# Patient Record
Sex: Female | Born: 1960 | Race: White | Hispanic: No | Marital: Married | Smoking: Never smoker
Health system: Southern US, Community
[De-identification: ages and names within clinical notes are randomized; demographics above are authoritative.]

## PROBLEM LIST (undated history)

## (undated) DIAGNOSIS — K219 Gastro-esophageal reflux disease without esophagitis: Secondary | ICD-10-CM

## (undated) DIAGNOSIS — M1990 Unspecified osteoarthritis, unspecified site: Secondary | ICD-10-CM

## (undated) DIAGNOSIS — D801 Nonfamilial hypogammaglobulinemia: Secondary | ICD-10-CM

## (undated) DIAGNOSIS — E7800 Pure hypercholesterolemia, unspecified: Secondary | ICD-10-CM

## (undated) DIAGNOSIS — J45909 Unspecified asthma, uncomplicated: Secondary | ICD-10-CM

## (undated) DIAGNOSIS — G35 Multiple sclerosis: Secondary | ICD-10-CM

## (undated) HISTORY — PX: WISDOM TOOTH EXTRACTION: SHX21

---
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
## 1980-11-29 HISTORY — PX: WISDOM TOOTH EXTRACTION: SHX21

## 2001-01-12 ENCOUNTER — Other Ambulatory Visit: Admission: RE | Admit: 2001-01-12 | Discharge: 2001-01-12 | Payer: Self-pay | Admitting: Gynecology

## 2001-01-27 ENCOUNTER — Encounter: Admission: RE | Admit: 2001-01-27 | Discharge: 2001-01-27 | Payer: Self-pay | Admitting: Gynecology

## 2001-01-27 ENCOUNTER — Encounter: Payer: Self-pay | Admitting: Gynecology

## 2002-02-07 ENCOUNTER — Other Ambulatory Visit: Admission: RE | Admit: 2002-02-07 | Discharge: 2002-02-07 | Payer: Self-pay | Admitting: Gynecology

## 2002-02-14 ENCOUNTER — Encounter: Payer: Self-pay | Admitting: Gynecology

## 2002-02-14 ENCOUNTER — Encounter: Admission: RE | Admit: 2002-02-14 | Discharge: 2002-02-14 | Payer: Self-pay | Admitting: Gynecology

## 2003-02-12 ENCOUNTER — Other Ambulatory Visit: Admission: RE | Admit: 2003-02-12 | Discharge: 2003-02-12 | Payer: Self-pay | Admitting: Gynecology

## 2003-02-28 ENCOUNTER — Encounter: Payer: Self-pay | Admitting: Gynecology

## 2003-02-28 ENCOUNTER — Encounter: Admission: RE | Admit: 2003-02-28 | Discharge: 2003-02-28 | Payer: Self-pay | Admitting: Gynecology

## 2004-02-18 ENCOUNTER — Other Ambulatory Visit: Admission: RE | Admit: 2004-02-18 | Discharge: 2004-02-18 | Payer: Self-pay | Admitting: Gynecology

## 2004-03-12 ENCOUNTER — Encounter: Admission: RE | Admit: 2004-03-12 | Discharge: 2004-03-12 | Payer: Self-pay | Admitting: Gynecology

## 2005-02-18 ENCOUNTER — Other Ambulatory Visit: Admission: RE | Admit: 2005-02-18 | Discharge: 2005-02-18 | Payer: Self-pay | Admitting: Gynecology

## 2005-03-17 ENCOUNTER — Encounter: Admission: RE | Admit: 2005-03-17 | Discharge: 2005-03-17 | Payer: Self-pay | Admitting: Gynecology

## 2006-02-22 ENCOUNTER — Other Ambulatory Visit: Admission: RE | Admit: 2006-02-22 | Discharge: 2006-02-22 | Payer: Self-pay | Admitting: Gynecology

## 2006-03-18 ENCOUNTER — Encounter: Admission: RE | Admit: 2006-03-18 | Discharge: 2006-03-18 | Payer: Self-pay | Admitting: Gynecology

## 2007-02-28 ENCOUNTER — Other Ambulatory Visit: Admission: RE | Admit: 2007-02-28 | Discharge: 2007-02-28 | Payer: Self-pay | Admitting: Gynecology

## 2007-03-23 ENCOUNTER — Encounter: Admission: RE | Admit: 2007-03-23 | Discharge: 2007-03-23 | Payer: Self-pay | Admitting: Gynecology

## 2008-03-25 ENCOUNTER — Encounter: Admission: RE | Admit: 2008-03-25 | Discharge: 2008-03-25 | Payer: Self-pay | Admitting: Gynecology

## 2009-03-31 ENCOUNTER — Encounter: Admission: RE | Admit: 2009-03-31 | Discharge: 2009-03-31 | Payer: Self-pay | Admitting: Gynecology

## 2010-04-07 ENCOUNTER — Encounter: Admission: RE | Admit: 2010-04-07 | Discharge: 2010-04-07 | Payer: Self-pay | Admitting: Gynecology

## 2010-11-29 HISTORY — PX: COLONOSCOPY: SHX174

## 2011-03-17 ENCOUNTER — Other Ambulatory Visit: Payer: Self-pay | Admitting: Gynecology

## 2011-03-17 DIAGNOSIS — Z1231 Encounter for screening mammogram for malignant neoplasm of breast: Secondary | ICD-10-CM

## 2011-04-09 ENCOUNTER — Ambulatory Visit
Admission: RE | Admit: 2011-04-09 | Discharge: 2011-04-09 | Disposition: A | Discharge status: Home / Self Care | Payer: BC Managed Care – PPO | Source: Ambulatory Visit | Attending: Gynecology | Admitting: Gynecology

## 2011-04-09 DIAGNOSIS — Z1231 Encounter for screening mammogram for malignant neoplasm of breast: Secondary | ICD-10-CM

## 2012-03-13 ENCOUNTER — Other Ambulatory Visit: Payer: Self-pay | Admitting: Gynecology

## 2012-03-13 DIAGNOSIS — Z1231 Encounter for screening mammogram for malignant neoplasm of breast: Secondary | ICD-10-CM

## 2012-04-10 ENCOUNTER — Ambulatory Visit
Admission: RE | Admit: 2012-04-10 | Discharge: 2012-04-10 | Disposition: A | Discharge status: Home / Self Care | Payer: BC Managed Care – PPO | Source: Ambulatory Visit | Attending: Gynecology | Admitting: Gynecology

## 2012-04-10 DIAGNOSIS — Z1231 Encounter for screening mammogram for malignant neoplasm of breast: Secondary | ICD-10-CM

## 2013-03-08 ENCOUNTER — Other Ambulatory Visit: Payer: Self-pay

## 2013-03-08 DIAGNOSIS — Z1231 Encounter for screening mammogram for malignant neoplasm of breast: Secondary | ICD-10-CM

## 2013-04-09 DIAGNOSIS — M25572 Pain in left ankle and joints of left foot: Secondary | ICD-10-CM | POA: Insufficient documentation

## 2013-04-12 ENCOUNTER — Ambulatory Visit
Admission: RE | Admit: 2013-04-12 | Discharge: 2013-04-12 | Disposition: A | Discharge status: Home / Self Care | Payer: BC Managed Care – PPO | Source: Ambulatory Visit

## 2013-04-12 DIAGNOSIS — Z1231 Encounter for screening mammogram for malignant neoplasm of breast: Secondary | ICD-10-CM

## 2013-06-19 ENCOUNTER — Other Ambulatory Visit: Payer: Self-pay | Admitting: Orthopedic Surgery

## 2013-06-19 DIAGNOSIS — M25572 Pain in left ankle and joints of left foot: Secondary | ICD-10-CM

## 2013-06-19 DIAGNOSIS — R609 Edema, unspecified: Secondary | ICD-10-CM

## 2013-06-21 ENCOUNTER — Ambulatory Visit
Admission: RE | Admit: 2013-06-21 | Discharge: 2013-06-21 | Disposition: A | Discharge status: Home / Self Care | Payer: BC Managed Care – PPO | Source: Ambulatory Visit | Attending: Orthopedic Surgery | Admitting: Orthopedic Surgery

## 2013-06-21 DIAGNOSIS — R609 Edema, unspecified: Secondary | ICD-10-CM

## 2013-06-21 DIAGNOSIS — M25572 Pain in left ankle and joints of left foot: Secondary | ICD-10-CM

## 2014-03-11 ENCOUNTER — Other Ambulatory Visit: Payer: Self-pay

## 2014-03-11 DIAGNOSIS — Z1231 Encounter for screening mammogram for malignant neoplasm of breast: Secondary | ICD-10-CM

## 2014-04-16 ENCOUNTER — Ambulatory Visit
Admission: RE | Admit: 2014-04-16 | Discharge: 2014-04-16 | Disposition: A | Discharge status: Home / Self Care | Payer: BC Managed Care – PPO | Source: Ambulatory Visit

## 2014-04-16 ENCOUNTER — Encounter (INDEPENDENT_AMBULATORY_CARE_PROVIDER_SITE_OTHER): Payer: Self-pay

## 2014-04-16 DIAGNOSIS — Z1231 Encounter for screening mammogram for malignant neoplasm of breast: Secondary | ICD-10-CM

## 2014-04-17 ENCOUNTER — Other Ambulatory Visit: Payer: Self-pay | Admitting: Gynecology

## 2014-04-17 DIAGNOSIS — R928 Other abnormal and inconclusive findings on diagnostic imaging of breast: Secondary | ICD-10-CM

## 2014-04-29 ENCOUNTER — Ambulatory Visit
Admission: RE | Admit: 2014-04-29 | Discharge: 2014-04-29 | Disposition: A | Discharge status: Home / Self Care | Payer: BC Managed Care – PPO | Source: Ambulatory Visit | Attending: Gynecology | Admitting: Gynecology

## 2014-04-29 DIAGNOSIS — R928 Other abnormal and inconclusive findings on diagnostic imaging of breast: Secondary | ICD-10-CM

## 2014-10-08 ENCOUNTER — Other Ambulatory Visit: Payer: Self-pay | Admitting: Gynecology

## 2014-10-08 DIAGNOSIS — R921 Mammographic calcification found on diagnostic imaging of breast: Secondary | ICD-10-CM

## 2014-10-30 ENCOUNTER — Ambulatory Visit
Admission: RE | Admit: 2014-10-30 | Discharge: 2014-10-30 | Disposition: A | Discharge status: Home / Self Care | Payer: BC Managed Care – PPO | Source: Ambulatory Visit | Attending: Gynecology | Admitting: Gynecology

## 2014-10-30 DIAGNOSIS — R921 Mammographic calcification found on diagnostic imaging of breast: Secondary | ICD-10-CM

## 2015-03-13 ENCOUNTER — Other Ambulatory Visit: Payer: Self-pay

## 2015-03-13 DIAGNOSIS — Z1231 Encounter for screening mammogram for malignant neoplasm of breast: Secondary | ICD-10-CM

## 2015-05-02 ENCOUNTER — Ambulatory Visit
Admission: RE | Admit: 2015-05-02 | Discharge: 2015-05-02 | Disposition: A | Discharge status: Home / Self Care | Payer: 59 | Source: Ambulatory Visit

## 2015-05-02 DIAGNOSIS — Z1231 Encounter for screening mammogram for malignant neoplasm of breast: Secondary | ICD-10-CM

## 2015-06-10 ENCOUNTER — Encounter (HOSPITAL_COMMUNITY)
Admission: RE | Admit: 2015-06-10 | Discharge: 2015-06-10 | Disposition: A | Discharge status: Home / Self Care | Payer: 59 | Source: Ambulatory Visit | Attending: Psychiatry | Admitting: Psychiatry

## 2015-06-10 ENCOUNTER — Encounter (INDEPENDENT_AMBULATORY_CARE_PROVIDER_SITE_OTHER): Payer: Self-pay

## 2015-06-10 ENCOUNTER — Encounter (HOSPITAL_COMMUNITY): Payer: Self-pay

## 2015-06-10 ENCOUNTER — Other Ambulatory Visit (HOSPITAL_COMMUNITY): Payer: Self-pay | Admitting: Psychiatry

## 2015-06-10 DIAGNOSIS — G35 Multiple sclerosis: Secondary | ICD-10-CM | POA: Diagnosis not present

## 2015-06-10 HISTORY — DX: Multiple sclerosis: G35

## 2015-06-10 HISTORY — DX: Pure hypercholesterolemia, unspecified: E78.00

## 2015-06-10 MED ORDER — ACETAMINOPHEN 500 MG PO TABS
500.0000 mg | ORAL_TABLET | Freq: Once | ORAL | Status: AC
  Administered 2015-06-10: 500 mg via ORAL
  Filled 2015-06-10: qty 1

## 2015-06-10 MED ORDER — METHYLPREDNISOLONE SODIUM SUCC 125 MG IJ SOLR
125.0000 mg | Freq: Every day | INTRAMUSCULAR | Status: DC
  Administered 2015-06-10: 125 mg via INTRAVENOUS
  Filled 2015-06-10 (×2): qty 2

## 2015-06-10 MED ORDER — SODIUM CHLORIDE 0.9 % IV SOLN
1000.0000 mg | Freq: Every day | INTRAVENOUS | Status: DC
  Administered 2015-06-10: 1000 mg via INTRAVENOUS
  Filled 2015-06-10 (×2): qty 100

## 2015-06-10 MED ORDER — SODIUM CHLORIDE 0.9 % IV SOLN
Freq: Every day | INTRAVENOUS | Status: DC
  Administered 2015-06-10: 250 mL via INTRAVENOUS

## 2015-06-10 MED ORDER — DIPHENHYDRAMINE HCL 50 MG/ML IJ SOLN
INTRAMUSCULAR | Status: AC
  Filled 2015-06-10: qty 1

## 2015-06-10 MED ORDER — ACETAMINOPHEN 500 MG PO TABS
500.0000 mg | ORAL_TABLET | Freq: Every day | ORAL | Status: DC
  Administered 2015-06-10: 500 mg via ORAL
  Filled 2015-06-10 (×2): qty 1

## 2015-06-10 MED ORDER — DIPHENHYDRAMINE HCL 50 MG/ML IJ SOLN
50.0000 mg | Freq: Every day | INTRAMUSCULAR | Status: DC
  Administered 2015-06-10: 50 mg via INTRAVENOUS
  Filled 2015-06-10: qty 1

## 2015-06-10 MED ORDER — DIPHENHYDRAMINE HCL 50 MG/ML IJ SOLN
25.0000 mg | Freq: Once | INTRAMUSCULAR | Status: AC
  Administered 2015-06-10: 25 mg via INTRAVENOUS

## 2015-06-10 NOTE — Discharge Instructions (Signed)
Rituximab injection What is this medicine? RITUXIMAB (ri TUX i mab) is a monoclonal antibody. This medicine changes the way the body's immune system works. It is used commonly to treat non-Hodgkin's lymphoma and other conditions. In cancer cells, this drug targets a specific protein within cancer cells and stops the cancer cells from growing. It is also used to treat rhuematoid arthritis (RA). In RA, this medicine slow the inflammatory process and help reduce joint pain and swelling. This medicine is often used with other cancer or arthritis medications. This medicine may be used for other purposes; ask your health care provider or pharmacist if you have questions. COMMON BRAND NAME(S): Rituxan What should I tell my health care provider before I take this medicine? They need to know if you have any of these conditions: -blood disorders -heart disease -history of hepatitis B -infection (especially a virus infection such as chickenpox, cold sores, or herpes) -irregular heartbeat -kidney disease -lung or breathing disease, like asthma -lupus -an unusual or allergic reaction to rituximab, mouse proteins, other medicines, foods, dyes, or preservatives -pregnant or trying to get pregnant -breast-feeding How should I use this medicine? This medicine is for infusion into a vein. It is administered in a hospital or clinic by a specially trained health care professional. A special MedGuide will be given to you by the pharmacist with each prescription and refill. Be sure to read this information carefully each time. Talk to your pediatrician regarding the use of this medicine in children. This medicine is not approved for use in children. Overdosage: If you think you have taken too much of this medicine contact a poison control center or emergency room at once. NOTE: This medicine is only for you. Do not share this medicine with others. What if I miss a dose? It is important not to miss a dose. Call  your doctor or health care professional if you are unable to keep an appointment. What may interact with this medicine? -cisplatin -medicines for blood pressure -some other medicines for arthritis -vaccines This list may not describe all possible interactions. Give your health care provider a list of all the medicines, herbs, non-prescription drugs, or dietary supplements you use. Also tell them if you smoke, drink alcohol, or use illegal drugs. Some items may interact with your medicine. What should I watch for while using this medicine? Report any side effects that you notice during your treatment right away, such as changes in your breathing, fever, chills, dizziness or lightheadedness. These effects are more common with the first dose. Visit your prescriber or health care professional for checks on your progress. You will need to have regular blood work. Report any other side effects. The side effects of this medicine can continue after you finish your treatment. Continue your course of treatment even though you feel ill unless your doctor tells you to stop. Call your doctor or health care professional for advice if you get a fever, chills or sore throat, or other symptoms of a cold or flu. Do not treat yourself. This drug decreases your body's ability to fight infections. Try to avoid being around people who are sick. This medicine may increase your risk to bruise or bleed. Call your doctor or health care professional if you notice any unusual bleeding. Be careful brushing and flossing your teeth or using a toothpick because you may get an infection or bleed more easily. If you have any dental work done, tell your dentist you are receiving this medicine. Avoid taking products  that contain aspirin, acetaminophen, ibuprofen, naproxen, or ketoprofen unless instructed by your doctor. These medicines may hide a fever. Do not become pregnant while taking this medicine. Women should inform their doctor  if they wish to become pregnant or think they might be pregnant. There is a potential for serious side effects to an unborn child. Talk to your health care professional or pharmacist for more information. Do not breast-feed an infant while taking this medicine. What side effects may I notice from receiving this medicine? Side effects that you should report to your doctor or health care professional as soon as possible: -allergic reactions like skin rash, itching or hives, swelling of the face, lips, or tongue -low blood counts - this medicine may decrease the number of white blood cells, red blood cells and platelets. You may be at increased risk for infections and bleeding. -signs of infection - fever or chills, cough, sore throat, pain or difficulty passing urine -signs of decreased platelets or bleeding - bruising, pinpoint red spots on the skin, black, tarry stools, blood in the urine -signs of decreased red blood cells - unusually weak or tired, fainting spells, lightheadedness -breathing problems -confused, not responsive -chest pain -fast, irregular heartbeat -feeling faint or lightheaded, falls -mouth sores -redness, blistering, peeling or loosening of the skin, including inside the mouth -stomach pain -swelling of the ankles, feet, or hands -trouble passing urine or change in the amount of urine Side effects that usually do not require medical attention (report to your doctor or other health care professional if they continue or are bothersome): -anxiety -headache -loss of appetite -muscle aches -nausea -night sweats This list may not describe all possible side effects. Call your doctor for medical advice about side effects. You may report side effects to FDA at 1-800-FDA-1088. Where should I keep my medicine? This drug is given in a hospital or clinic and will not be stored at home. NOTE: This sheet is a summary. It may not cover all possible information. If you have questions  about this medicine, talk to your doctor, pharmacist, or health care provider.  2015, Elsevier/Gold Standard. (2008-07-15 14:04:59) Multiple Sclerosis Multiple sclerosis (MS) is a disease of the central nervous system. It leads to the loss of the insulating covering of the nerves (myelin sheath) of your brain. When this happens, brain signals do not get sent properly or may not get sent at all. The age of onset of MS varies.  CAUSES The cause of MS is unknown. However, it is more common in the [place] than in the [place]. RISK FACTORS There is a higher number of women with MS than men. MS is not an illness that is passed down to you from your family members (inherited). However, your risk of MS is higher if you have a relative with MS. SIGNS AND SYMPTOMS  The symptoms of MS occur in episodes or attacks. These attacks may last weeks to months. There may be long periods of almost no symptoms between attacks. The symptoms of MS vary. This is because of the many different ways it affects the central nervous system. The main symptoms of MS include:  Vision problems and eye pain.  Numbness.  Weakness.  Inability to move your arms, hands, feet, or legs (paralysis).  Balance problems.  Tremors. DIAGNOSIS  Your health care provider can diagnose MS with the help of imaging exams and lab tests. These may include specialized X-ray exams and spinal fluid tests. The best imaging exam  to confirm a diagnosis of MS is an MRI. TREATMENT  There is no known cure for MS, but there are medicines that can decrease the number and frequency of attacks. Steroids are often used for short-term relief. Physical and occupational therapy may also help. There are also many new alternative or complementary treatments available to help control the symptoms of MS. Ask your health care provider if any of these other options are right for you. HOME CARE INSTRUCTIONS   Take medicines as directed  by your health care provider.  Exercise as directed by your health care provider. SEEK MEDICAL CARE IF: You begin to feel depressed. SEEK IMMEDIATE MEDICAL CARE IF:  You develop paralysis.  You have problems with bladder, bowel, or sexual function.  You develop mental changes, such as forgetfulness or mood swings.  You have a period of uncontrolled movements (seizure). Document Released: 11/12/2000 Document Revised: 11/20/2013 Document Reviewed: 07/23/2013 [facility] Patient Information 2015 [place], [place]. This information is not intended to replace advice given to you by your health care provider. Make sure you discuss any questions you have with your health care provider.

## 2015-06-10 NOTE — Progress Notes (Signed)
Ear and throat itching have both resolved. Saline still infusing at [facility]. I have placed call to Dr Bonnee Quin office x2 requesting the faxed orders for additional Benadryl and left voice mail messages on [facility] and [facility] VM. Pt is eating lunch and voicing no c/o

## 2015-06-10 NOTE — Progress Notes (Signed)
Patient dozing at intervals. Stated tickling throat has subsided after Rituxan paused and before benadyl given. Denies any other problems.

## 2015-06-10 NOTE — Progress Notes (Signed)
Pt here for her 1st Rituxan infusion. Premeds given as ordered It was started at 0950 and ramped up at 30 minute intervals as ordered. As I was entering room to increase to 200mg /hr rate pt stated that her"   throat begin itching 10 minutes ago and she was very cold" Rituxan turned off ( she had only received 67 ml) and saline infusing. VSS (see flow chart) placed call to Dr Jacqulynn Cadet and spoke to his nurse Janett Billow who relayed this information to Dr Dellis Filbert. Orders to be sent to give additional Benadryl and after 15-20 minutes restart Rituxan at a slower rate.

## 2015-06-10 NOTE — Progress Notes (Signed)
Restarting infusion of RITUXAN at 100mg /hr (37 ml/hr) and evaluating patient for any further c/o

## 2015-06-10 NOTE — Progress Notes (Signed)
Again called office to request faxed orders for Benadryl. Spoke with [place] and orders received. Benadryl given as ordered. To resume RITUXAN in 15 minutes

## 2015-06-10 NOTE — Progress Notes (Addendum)
C/o mild itching of right ear but the throat itching is resolving. Pt watching TV.Rituxan remains off

## 2015-06-10 NOTE — Progress Notes (Signed)
Continuing infusion of Rituxan at 100mg /hr (3ml/hr). Pt states she has a slight headache. Tylenol  500 mg po repeated as ordered. Pt up to BR. Watching TV and texting. Voices no other c/o at this rate. Will continue at this rate until end of infusion

## 2015-06-24 ENCOUNTER — Encounter (HOSPITAL_COMMUNITY)
Admission: RE | Admit: 2015-06-24 | Discharge: 2015-06-24 | Disposition: A | Discharge status: Home / Self Care | Payer: 59 | Source: Ambulatory Visit | Attending: Psychiatry | Admitting: Psychiatry

## 2015-06-24 ENCOUNTER — Encounter (HOSPITAL_COMMUNITY): Payer: Self-pay

## 2015-06-24 DIAGNOSIS — G35 Multiple sclerosis: Secondary | ICD-10-CM | POA: Diagnosis not present

## 2015-06-24 MED ORDER — DIPHENHYDRAMINE HCL 50 MG/ML IJ SOLN
50.0000 mg | Freq: Every day | INTRAMUSCULAR | Status: AC
  Administered 2015-06-24: 50 mg via INTRAVENOUS
  Filled 2015-06-24: qty 1

## 2015-06-24 MED ORDER — SODIUM CHLORIDE 0.9 % IV SOLN
Freq: Every day | INTRAVENOUS | Status: AC
  Administered 2015-06-24: 250 mL via INTRAVENOUS

## 2015-06-24 MED ORDER — ACETAMINOPHEN 500 MG PO TABS
500.0000 mg | ORAL_TABLET | Freq: Every day | ORAL | Status: AC
  Administered 2015-06-24: 500 mg via ORAL
  Filled 2015-06-24: qty 1

## 2015-06-24 MED ORDER — ACETAMINOPHEN 500 MG PO TABS: 500.0000 mg | ORAL_TABLET | Freq: Once | ORAL | Status: AC | PRN

## 2015-06-24 MED ORDER — METHYLPREDNISOLONE SODIUM SUCC 125 MG IJ SOLR
125.0000 mg | Freq: Every day | INTRAMUSCULAR | Status: AC
  Administered 2015-06-24: 125 mg via INTRAVENOUS
  Filled 2015-06-24: qty 2

## 2015-06-24 MED ORDER — SODIUM CHLORIDE 0.9 % IV SOLN
1000.0000 mg | Freq: Every day | INTRAVENOUS | Status: AC
  Administered 2015-06-24: 1000 mg via INTRAVENOUS
  Filled 2015-06-24: qty 100

## 2015-06-24 NOTE — Progress Notes (Signed)
Pt arrived today for her 2nd infusion of Rituxan in this series. After last infusion, in which she had some throat itching that was treated with a slower rate and additional Pura Spice, she had an appointment with Dr Rudell Cobb.it was decided to continue with her 2nd infusion and tentatively to receive it every 6 months. I spoke to [facility] at dr [facility] office who confirmed this and sent additional orders for rescue medication . We discussed that  that the infusion today was still be as Dose 1 with the ramp up of 50 mg/hr every 30 minutes to a maximum of 400mg /hr. Premeds of Tylenol,Benadryl and Solumedrol given as ordered. Pt states she had also taken 400mg  of Ibuprofen this AM prior to arrival. Infusion progressed and infused over 5 hours and pt had no c/o and VSS. Pt and husband very pleased that she had an uneventful  2nd infusion. She has an appointment with Dr Jacqulynn Cadet October 2016 and I have a tentative appointment scheduled for here in Short Stay for an appointment in 6 months ( 12/23/15). We will call for Rituxan orders.

## 2015-12-23 ENCOUNTER — Encounter (HOSPITAL_COMMUNITY): Admission: RE | Admit: 2015-12-23 | Payer: 59 | Source: Ambulatory Visit

## 2017-01-25 DIAGNOSIS — L309 Dermatitis, unspecified: Secondary | ICD-10-CM | POA: Diagnosis not present

## 2017-01-27 DIAGNOSIS — G35 Multiple sclerosis: Secondary | ICD-10-CM | POA: Diagnosis not present

## 2017-01-27 DIAGNOSIS — D72819 Decreased white blood cell count, unspecified: Secondary | ICD-10-CM | POA: Diagnosis not present

## 2017-02-10 DIAGNOSIS — G35 Multiple sclerosis: Secondary | ICD-10-CM | POA: Diagnosis not present

## 2017-02-15 DIAGNOSIS — Z8719 Personal history of other diseases of the digestive system: Secondary | ICD-10-CM | POA: Diagnosis not present

## 2017-02-15 DIAGNOSIS — E782 Mixed hyperlipidemia: Secondary | ICD-10-CM | POA: Diagnosis not present

## 2017-02-15 DIAGNOSIS — J309 Allergic rhinitis, unspecified: Secondary | ICD-10-CM | POA: Diagnosis not present

## 2017-02-23 DIAGNOSIS — R945 Abnormal results of liver function studies: Secondary | ICD-10-CM | POA: Diagnosis not present

## 2017-04-14 DIAGNOSIS — L718 Other rosacea: Secondary | ICD-10-CM | POA: Diagnosis not present

## 2017-06-06 DIAGNOSIS — D225 Melanocytic nevi of trunk: Secondary | ICD-10-CM | POA: Diagnosis not present

## 2017-06-06 DIAGNOSIS — L718 Other rosacea: Secondary | ICD-10-CM | POA: Diagnosis not present

## 2017-06-06 DIAGNOSIS — L814 Other melanin hyperpigmentation: Secondary | ICD-10-CM | POA: Diagnosis not present

## 2017-06-16 DIAGNOSIS — N951 Menopausal and female climacteric states: Secondary | ICD-10-CM | POA: Diagnosis not present

## 2017-06-16 DIAGNOSIS — Z1231 Encounter for screening mammogram for malignant neoplasm of breast: Secondary | ICD-10-CM | POA: Diagnosis not present

## 2017-06-16 DIAGNOSIS — Z01419 Encounter for gynecological examination (general) (routine) without abnormal findings: Secondary | ICD-10-CM | POA: Diagnosis not present

## 2017-08-04 DIAGNOSIS — G35 Multiple sclerosis: Secondary | ICD-10-CM | POA: Diagnosis not present

## 2017-08-04 DIAGNOSIS — D72819 Decreased white blood cell count, unspecified: Secondary | ICD-10-CM | POA: Diagnosis not present

## 2017-08-18 DIAGNOSIS — Z23 Encounter for immunization: Secondary | ICD-10-CM | POA: Diagnosis not present

## 2017-08-18 DIAGNOSIS — G35 Multiple sclerosis: Secondary | ICD-10-CM | POA: Diagnosis not present

## 2017-09-22 DIAGNOSIS — J309 Allergic rhinitis, unspecified: Secondary | ICD-10-CM | POA: Diagnosis not present

## 2017-09-22 DIAGNOSIS — E782 Mixed hyperlipidemia: Secondary | ICD-10-CM | POA: Diagnosis not present

## 2017-09-22 DIAGNOSIS — G4700 Insomnia, unspecified: Secondary | ICD-10-CM | POA: Diagnosis not present

## 2017-10-19 DIAGNOSIS — G35 Multiple sclerosis: Secondary | ICD-10-CM | POA: Diagnosis not present

## 2018-02-21 DIAGNOSIS — G35 Multiple sclerosis: Secondary | ICD-10-CM | POA: Diagnosis not present

## 2018-02-21 DIAGNOSIS — D72819 Decreased white blood cell count, unspecified: Secondary | ICD-10-CM | POA: Diagnosis not present

## 2018-02-22 DIAGNOSIS — D72819 Decreased white blood cell count, unspecified: Secondary | ICD-10-CM | POA: Diagnosis not present

## 2018-03-09 DIAGNOSIS — G35 Multiple sclerosis: Secondary | ICD-10-CM | POA: Diagnosis not present

## 2018-03-14 DIAGNOSIS — J309 Allergic rhinitis, unspecified: Secondary | ICD-10-CM | POA: Diagnosis not present

## 2018-03-14 DIAGNOSIS — G35 Multiple sclerosis: Secondary | ICD-10-CM | POA: Diagnosis not present

## 2018-03-14 DIAGNOSIS — H04123 Dry eye syndrome of bilateral lacrimal glands: Secondary | ICD-10-CM | POA: Diagnosis not present

## 2018-03-14 DIAGNOSIS — H40013 Open angle with borderline findings, low risk, bilateral: Secondary | ICD-10-CM | POA: Diagnosis not present

## 2018-03-14 DIAGNOSIS — E782 Mixed hyperlipidemia: Secondary | ICD-10-CM | POA: Diagnosis not present

## 2018-03-14 DIAGNOSIS — G4700 Insomnia, unspecified: Secondary | ICD-10-CM | POA: Diagnosis not present

## 2018-04-27 DIAGNOSIS — T22111A Burn of first degree of right forearm, initial encounter: Secondary | ICD-10-CM | POA: Diagnosis not present

## 2018-06-08 DIAGNOSIS — D1801 Hemangioma of skin and subcutaneous tissue: Secondary | ICD-10-CM | POA: Diagnosis not present

## 2018-06-08 DIAGNOSIS — L814 Other melanin hyperpigmentation: Secondary | ICD-10-CM | POA: Diagnosis not present

## 2018-06-08 DIAGNOSIS — D225 Melanocytic nevi of trunk: Secondary | ICD-10-CM | POA: Diagnosis not present

## 2018-06-20 ENCOUNTER — Ambulatory Visit (INDEPENDENT_AMBULATORY_CARE_PROVIDER_SITE_OTHER): Payer: 59 | Admitting: Physician Assistant

## 2018-06-20 ENCOUNTER — Encounter: Payer: Self-pay | Admitting: Physician Assistant

## 2018-06-20 ENCOUNTER — Other Ambulatory Visit: Payer: Self-pay

## 2018-06-20 VITALS — BP 118/69 | HR 80 | Temp 97.9°F | Resp 17 | Ht 64.0 in | Wt 166.6 lb

## 2018-06-20 DIAGNOSIS — J209 Acute bronchitis, unspecified: Secondary | ICD-10-CM

## 2018-06-20 DIAGNOSIS — J3489 Other specified disorders of nose and nasal sinuses: Secondary | ICD-10-CM | POA: Diagnosis not present

## 2018-06-20 MED ORDER — BENZONATATE 100 MG PO CAPS: 100.0000 mg | ORAL_CAPSULE | Freq: Three times a day (TID) | ORAL | 0 refills | Status: DC | PRN

## 2018-06-20 MED ORDER — HYDROCODONE-HOMATROPINE 5-1.5 MG/5ML PO SYRP: 5.0000 mL | ORAL_SOLUTION | Freq: Three times a day (TID) | ORAL | 0 refills | Status: DC | PRN

## 2018-06-20 MED ORDER — IPRATROPIUM BROMIDE 0.03 % NA SOLN: 2.0000 | Freq: Two times a day (BID) | NASAL | 0 refills | Status: DC

## 2018-06-20 MED ORDER — AZITHROMYCIN 250 MG PO TABS: ORAL_TABLET | ORAL | 0 refills | Status: DC

## 2018-06-20 NOTE — Patient Instructions (Addendum)
- We will treat this for underlying bacterial etiology with a zpack. I also recommend symptomatic treatment. - I recommend you rest, drink plenty of fluids, eat light meals including soups.  - You may use cough syrup at night for your cough and sore throat, Tessalon pearls during the day. Be aware that cough syrup can definitely make you drowsy and sleepy so do not drive or operate any heavy machinery if it is affecting you during the day.  -You can use atrovent nasal spray for runny nose. Tea recipe for cough: boil water, add 2 inches shaved ginger root, steep 15 minutes, add juice from 2 full lemons, and 2 tbsp honey. - Please let me know if you are not seeing any improvement or get worse in 7 days or if any of your symptoms worsen with current treatment.   Acute Bronchitis, Adult Acute bronchitis is when air tubes (bronchi) in the lungs suddenly get swollen. The condition can make it hard to breathe. It can also cause these symptoms:  A cough.  Coughing up clear, yellow, or green mucus.  Wheezing.  Chest congestion.  Shortness of breath.  A fever.  Body aches.  Chills.  A sore throat.  Follow these instructions at home: Medicines  Take over-the-counter and prescription medicines only as told by your doctor.  If you were prescribed an antibiotic medicine, take it as told by your doctor. Do not stop taking the antibiotic even if you start to feel better. General instructions  Rest.  Drink enough fluids to keep your pee (urine) clear or pale yellow.  Avoid smoking and secondhand smoke. If you smoke and you need help quitting, ask your doctor. Quitting will help your lungs heal faster.  Use an inhaler, cool mist vaporizer, or humidifier as told by your doctor.  Keep all follow-up visits as told by your doctor. This is important. How is this prevented? To lower your risk of getting this condition again:  Wash your hands often with soap and water. If you cannot use soap  and water, use hand sanitizer.  Avoid contact with people who have cold symptoms.  Try not to touch your hands to your mouth, nose, or eyes.  Make sure to get the flu shot every year.  Contact a doctor if:  Your symptoms do not get better in 2 weeks. Get help right away if:  You cough up blood.  You have chest pain.  You have very bad shortness of breath.  You become dehydrated.  You faint (pass out) or keep feeling like you are going to pass out.  You keep throwing up (vomiting).  You have a very bad headache.  Your fever or chills gets worse. This information is not intended to replace advice given to you by your health care provider. Make sure you discuss any questions you have with your health care provider. Document Released: 05/03/2008 Document Revised: 06/23/2016 Document Reviewed: 05/05/2016 Elsevier Interactive Patient Education  2018 [employer].    IF you received an x-ray today, you will receive an invoice from [facility]. Please contact [facility] at [phone] with questions or concerns regarding your invoice.   IF you received labwork today, you will receive an invoice from [place]. Please contact [facility] at [phone] with questions or concerns regarding your invoice.   Our billing staff will not be able to assist you with questions regarding bills from these companies.  You will be contacted with the lab results as soon as they are available.  The fastest way to get your results is to activate your My Chart account. Instructions are located on the last page of this paperwork. If you have not heard from [place] regarding the results in 2 weeks, please contact this office.

## 2018-06-20 NOTE — Progress Notes (Signed)
MRN: [id number] DOB: [birth year]  Subjective:   Mary Odom is a 57 y.o. female presenting for chief complaint of URI (x 2 weeks, sudafed, dayquil, nyquil and delsym.  Lg temps, 99.3 this morning, coughing started last thursday 06/15/18 and head cold, green/yellow mucus drainage.  Son dx w/ pnuemonia) .  Was sick in 03/2018 with head cold and chest congestion, got better, but then returned 2 weeks ago. Reports 2 week history of illness. Has nasal congestion, rhinorrhea,, productive cough, and low grade fever. Has tried nyquil, dayquil, sudafed with some relief. Denies ear pain, sore throat, wheezing, shortness of breath and chest pain, sinus pain,  nausea, vomiting, abdominal pain and diarrhea. Has had sick contact with son who was diagnosed pneumonia. Marland Kitchen Has history of seasonal allergies, no history of asthma or COPD. Has PMH of MS. Patient has had flu shot this season. Denies smoking. Denies any other aggravating or relieving factors, no other questions or concerns.  Mary Odom has a current medication list which includes the following prescription(s): aspirin, cetirizine, gabapentin, ibuprofen, modafinil, fish oil, polyethylene glycol, psyllium, pyridoxine, simvastatin, temazepam, and vitamin d (ergocalciferol). Also has No Known Allergies.  Mary Odom  has a past medical history of High cholesterol and MS (multiple sclerosis) ([place]). Also  has a past surgical history that includes Wisdom tooth extraction and Colonoscopy (2012).   Objective:   Vitals: BP 118/69 (BP Location: Left Arm, Patient Position: Sitting, Cuff Size: Large)   Pulse 80   Temp 97.9 F (36.6 C) (Oral)   Resp 17   Ht 5\' 4"  (1.626 m)   Wt 166 lb 9.6 oz (75.6 kg)   LMP 04/04/2011   SpO2 97%   BMI 28.60 kg/m   Physical Exam  Constitutional: She is oriented to person, place, and time. She appears well-developed and well-nourished. No distress.  HENT:  Head: Normocephalic and atraumatic.  Right Ear: Tympanic membrane, external  ear and ear canal normal.  Left Ear: Tympanic membrane, external ear and ear canal normal.  Nose: Mucosal edema and rhinorrhea present. Right sinus exhibits no maxillary sinus tenderness and no frontal sinus tenderness. Left sinus exhibits no maxillary sinus tenderness and no frontal sinus tenderness.  Mouth/Throat: Uvula is midline and mucous membranes are normal. No posterior oropharyngeal edema, posterior oropharyngeal erythema or tonsillar abscesses. No tonsillar exudate.  Eyes: Conjunctivae are normal.  Neck: Normal range of motion.  Cardiovascular: Normal rate, regular rhythm, normal heart sounds and intact distal pulses.  Pulmonary/Chest: Effort normal and breath sounds normal. No respiratory distress. She has no decreased breath sounds. She has no wheezes. She has no rhonchi. She has no rales.  Lymphadenopathy:       Head (right side): No submental, no submandibular, no tonsillar, no preauricular, no posterior auricular and no occipital adenopathy present.       Head (left side): No submental, no submandibular, no tonsillar, no preauricular, no posterior auricular and no occipital adenopathy present.    She has no cervical adenopathy.       Right: No supraclavicular adenopathy present.       Left: No supraclavicular adenopathy present.  Neurological: She is alert and oriented to person, place, and time.  Skin: Skin is warm and dry.  Psychiatric: She has a normal mood and affect.  Vitals reviewed.   No results found for this or any previous visit (from the past 24 hour(s)).  Assessment and Plan :  1. Acute bronchitis, unspecified organism Vitals stable, pt is overall well appearing, NAD.  Lungs CTAB. Due to duration and no improvement with sx tx. Rec oral abx for coverage of atypical organisms. Given sx tx as well. Advised to return to clinic if symptoms worsen, do not improve, or as needed.   - azithromycin (ZITHROMAX) 250 MG tablet; Take 2 tabs PO x 1 dose, then 1 tab PO QD x 4 days   Dispense: 6 tablet; Refill: 0 - benzonatate (TESSALON) 100 MG capsule; Take 1-2 capsules (100-200 mg total) by mouth 3 (three) times daily as needed for cough.  Dispense: 40 capsule; Refill: 0 - HYDROcodone-homatropine (HYCODAN) 5-1.5 MG/5ML syrup; Take 5 mLs by mouth every 8 (eight) hours as needed for cough.  Dispense: 75 mL; Refill: 0  2. Rhinorrhea - ipratropium (ATROVENT) 0.03 % nasal spray; Place 2 sprays into both nostrils 2 (two) times daily.  Dispense: 30 mL; Refill: 0  Tenna Delaine, PA-C  [facility] 06/20/2018 3:28 PM

## 2018-06-28 DIAGNOSIS — R05 Cough: Secondary | ICD-10-CM | POA: Diagnosis not present

## 2018-06-29 ENCOUNTER — Other Ambulatory Visit: Payer: Self-pay | Admitting: Family Medicine

## 2018-06-29 ENCOUNTER — Ambulatory Visit
Admission: RE | Admit: 2018-06-29 | Discharge: 2018-06-29 | Disposition: A | Discharge status: Home / Self Care | Payer: 59 | Source: Ambulatory Visit | Attending: Family Medicine | Admitting: Family Medicine

## 2018-06-29 DIAGNOSIS — R05 Cough: Secondary | ICD-10-CM

## 2018-06-29 DIAGNOSIS — Z1231 Encounter for screening mammogram for malignant neoplasm of breast: Secondary | ICD-10-CM | POA: Diagnosis not present

## 2018-06-29 DIAGNOSIS — Z01419 Encounter for gynecological examination (general) (routine) without abnormal findings: Secondary | ICD-10-CM | POA: Diagnosis not present

## 2018-06-29 DIAGNOSIS — R059 Cough, unspecified: Secondary | ICD-10-CM

## 2018-06-29 DIAGNOSIS — Z780 Asymptomatic menopausal state: Secondary | ICD-10-CM | POA: Diagnosis not present

## 2018-06-29 DIAGNOSIS — Z124 Encounter for screening for malignant neoplasm of cervix: Secondary | ICD-10-CM | POA: Diagnosis not present

## 2018-07-06 ENCOUNTER — Other Ambulatory Visit: Payer: Self-pay | Admitting: Gynecology

## 2018-07-06 DIAGNOSIS — R928 Other abnormal and inconclusive findings on diagnostic imaging of breast: Secondary | ICD-10-CM

## 2018-07-13 ENCOUNTER — Ambulatory Visit: Admission: RE | Admit: 2018-07-13 | Payer: 59 | Source: Ambulatory Visit

## 2018-07-13 ENCOUNTER — Ambulatory Visit
Admission: RE | Admit: 2018-07-13 | Discharge: 2018-07-13 | Disposition: A | Discharge status: Home / Self Care | Payer: 59 | Source: Ambulatory Visit | Attending: Gynecology | Admitting: Gynecology

## 2018-07-13 DIAGNOSIS — R922 Inconclusive mammogram: Secondary | ICD-10-CM | POA: Diagnosis not present

## 2018-07-13 DIAGNOSIS — R928 Other abnormal and inconclusive findings on diagnostic imaging of breast: Secondary | ICD-10-CM

## 2018-08-24 DIAGNOSIS — T22031A Burn of unspecified degree of right upper arm, initial encounter: Secondary | ICD-10-CM | POA: Diagnosis not present

## 2018-09-07 DIAGNOSIS — D72819 Decreased white blood cell count, unspecified: Secondary | ICD-10-CM | POA: Diagnosis not present

## 2018-09-07 DIAGNOSIS — Z23 Encounter for immunization: Secondary | ICD-10-CM | POA: Diagnosis not present

## 2018-09-07 DIAGNOSIS — G35 Multiple sclerosis: Secondary | ICD-10-CM | POA: Diagnosis not present

## 2018-09-19 DIAGNOSIS — T22011D Burn of unspecified degree of right forearm, subsequent encounter: Secondary | ICD-10-CM | POA: Diagnosis not present

## 2018-09-20 DIAGNOSIS — G4700 Insomnia, unspecified: Secondary | ICD-10-CM | POA: Diagnosis not present

## 2018-09-20 DIAGNOSIS — J309 Allergic rhinitis, unspecified: Secondary | ICD-10-CM | POA: Diagnosis not present

## 2018-09-20 DIAGNOSIS — E782 Mixed hyperlipidemia: Secondary | ICD-10-CM | POA: Diagnosis not present

## 2018-09-21 DIAGNOSIS — G35 Multiple sclerosis: Secondary | ICD-10-CM | POA: Diagnosis not present

## 2018-10-23 DIAGNOSIS — J069 Acute upper respiratory infection, unspecified: Secondary | ICD-10-CM | POA: Diagnosis not present

## 2018-12-05 ENCOUNTER — Encounter: Payer: Self-pay | Admitting: Allergy and Immunology

## 2018-12-05 ENCOUNTER — Ambulatory Visit (INDEPENDENT_AMBULATORY_CARE_PROVIDER_SITE_OTHER): Payer: 59 | Admitting: Allergy and Immunology

## 2018-12-05 VITALS — BP 112/76 | HR 64 | Temp 97.7°F | Resp 16 | Ht 63.5 in | Wt 162.0 lb

## 2018-12-05 DIAGNOSIS — D899 Disorder involving the immune mechanism, unspecified: Secondary | ICD-10-CM | POA: Diagnosis not present

## 2018-12-05 DIAGNOSIS — D849 Immunodeficiency, unspecified: Secondary | ICD-10-CM

## 2018-12-05 DIAGNOSIS — J324 Chronic pansinusitis: Secondary | ICD-10-CM

## 2018-12-05 DIAGNOSIS — K219 Gastro-esophageal reflux disease without esophagitis: Secondary | ICD-10-CM

## 2018-12-05 DIAGNOSIS — J4540 Moderate persistent asthma, uncomplicated: Secondary | ICD-10-CM

## 2018-12-05 MED ORDER — METHYLPREDNISOLONE ACETATE 80 MG/ML IJ SUSP
80.0000 mg | Freq: Once | INTRAMUSCULAR | Status: AC
  Administered 2018-12-05: 80 mg via INTRAMUSCULAR

## 2018-12-05 MED ORDER — OMEPRAZOLE 40 MG PO CPDR: 40.0000 mg | DELAYED_RELEASE_CAPSULE | Freq: Every day | ORAL | 5 refills | Status: DC

## 2018-12-05 MED ORDER — BUDESONIDE-FORMOTEROL FUMARATE 160-4.5 MCG/ACT IN AERO: 2.0000 | INHALATION_SPRAY | Freq: Two times a day (BID) | RESPIRATORY_TRACT | 5 refills | Status: DC

## 2018-12-05 NOTE — Patient Instructions (Addendum)
  1.  Allergen avoidance measures  2.  Treat and prevent inflammation:   A.  Symbicort 160 -2 inhalations twice a day  B.  OTC Nasacort -1 spray each nostril daily  C.  Montelukast 10 mg -1 tablet daily  D. Depomedrol 80 IM delivered in clinic  3.  Treat and prevent reflux:   A.  Consolidate caffeine and chocolate consumption  B.  Omeprazole 40 mg -1 tablet twice a day  4.  Obtain a sinus CT scan for chronic sinusitis  5.  Blood - CBC w/diff, IgA/G/M, anti-pneumo ab, anti-tetanus ab  6.  Return to clinic in 3 weeks or earlier if problem

## 2018-12-05 NOTE — Progress Notes (Signed)
Dear Dr. Moreen Fowler,  Thank you for referring Mary Odom to the [facility] on 12/05/2018.   Below is a summation of this patient's evaluation and recommendations.  Thank you for your referral. I will keep you informed about this patient's response to treatment.   If you have any questions please do not hesitate to contact me.   Sincerely,  Jiles Prows, MD Allergy / Immunology [facility]   ______________________________________________________________________    NEW PATIENT NOTE  Referring Provider: Antony Contras, MD Primary Provider: Antony Contras, MD Date of office visit: 12/05/2018    Subjective:   Chief Complaint:  Mary Odom (DOB: 14-Jul-1961) is a 58 y.o. female who presents to the clinic on 12/05/2018 with a chief complaint of New Patient (Initial Visit) (cough on and off since May 2019) .     HPI: Mary Odom presents to this clinic in evaluation of cough.  Mary Odom has a long history of issues with allergic rhinitis and postnasal drip for which she will use Flonase and Zyrtec intermittently of many years duration.  Sometimes the Flonase gives rise to a little bit of bleeding.  But overall she thought that her allergies were under very good control until something changed this summer.  In May 2019 she developed "crud" with laryngitis which appeared to resolve only to return in July after contracting an upper respiratory tract infection.  She has been coughing like crazy since then.  She has spontaneous cough with spells of cough and occasional green to yellow sputum production and intermittent raspy voice and throat clearing.  She does not have a tremendous amount of shortness of breath.  She has not heard any wheezing.   Evaluation for her cough has included a chest x-ray which apparently is normal and she has been treated with 3 antibiotics.  In addition, she takes Delsym every night  and she also chews gum and drinks water to suppress her cough.  She also appears to have intermittent burping.  She does consume 2 cups of coffee in the morning and chocolate about 4 times per week and occasionally tea at dinner.  She has been on fish oil for the past 2 years.  She does have a history of MS for which she is using rituximab every 6 months for the past several years.  Her last infusion was summer 2019.  Past Medical History:  Diagnosis Date  . High cholesterol   . MS (multiple sclerosis) ([place])    Dx 1986    Past Surgical History:  Procedure Laterality Date  . COLONOSCOPY  2012   2 benign polyps  . WISDOM TOOTH EXTRACTION      Allergies as of 12/05/2018   No Known Allergies     Medication List      aspirin 81 MG tablet Take 81 mg by mouth daily.   cetirizine 10 MG tablet Commonly known as:  ZYRTEC Take 10 mg by mouth daily.   Fish Oil 1000 MG Caps Take 1 capsule by mouth daily.   gabapentin 300 MG capsule Commonly known as:  NEURONTIN Take 300 mg by mouth at bedtime.   HYDROcodone-homatropine 5-1.5 MG/5ML syrup Commonly known as:  HYCODAN Take 5 mLs by mouth every 8 (eight) hours as needed for cough.   ibuprofen 200 MG tablet Commonly known as:  ADVIL,MOTRIN Take 400 mg by mouth daily.   ipratropium 0.03 % nasal spray Commonly known as:  ATROVENT Place 2 sprays into both nostrils 2 (two) times daily.   LUTEIN PO lutein   Lutein-Zeaxanthin 25-5 MG Caps lutein-zeaxanthin   modafinil 200 MG tablet Commonly known as:  PROVIGIL Take 200 mg by mouth daily.   MIRALAX PO Miralax   polyethylene glycol packet Commonly known as:  MIRALAX / GLYCOLAX Take 17 g by mouth daily. Taken with a tablespoon of Metamucil in 8 oz water   METAMUCIL 0.36 g Caps Generic drug:  Psyllium Metamucil   psyllium 58.6 % packet Commonly known as:  METAMUCIL Take 1 packet by mouth daily. Taken with Miralax, I tbsp mixed in 8 oz water each AM   pyridOXINE 50 MG  tablet Commonly known as:  VITAMIN B-6 Take 50 mg by mouth daily.   RITUXAN 100 MG/10ML injection Generic drug:  riTUXimab Rituxan 10 mg/mL concentrate,intravenous  Inject by intravenous route.   simvastatin 20 MG tablet Commonly known as:  ZOCOR Take 20 mg by mouth at bedtime.   temazepam 15 MG capsule Commonly known as:  RESTORIL Take 15 mg by mouth at bedtime as needed for sleep.   Vitamin D (Ergocalciferol) 1.25 MG (50000 UT) Caps capsule Commonly known as:  DRISDOL Take 2,000 Units by mouth daily.       Review of systems negative except as noted in HPI / PMHx or noted below:  Review of Systems  Constitutional: Negative.   HENT: Negative.   Eyes: Negative.   Respiratory: Negative.   Cardiovascular: Negative.   Gastrointestinal: Negative.   Genitourinary: Negative.   Musculoskeletal: Negative.   Skin: Negative.   Neurological: Negative.   Endo/Heme/Allergies: Negative.   Psychiatric/Behavioral: Negative.     Family History  Problem Relation Age of Onset  . Asthma Daughter   . Asthma Son   . Urticaria Neg Hx   . Eczema Neg Hx   . Allergic rhinitis Neg Hx     Social History   Socioeconomic History  . Marital status: Married    Spouse name: Not on file  . Number of children: Not on file  . Years of education: Not on file  . Highest education level: Not on file  Occupational History  . Not on file  Social Needs  . Financial resource strain: Not on file  . Food insecurity:    Worry: Not on file    Inability: Not on file  . Transportation needs:    Medical: Not on file    Non-medical: Not on file  Tobacco Use  . Smoking status: Never Smoker  . Smokeless tobacco: Never Used  Substance and Sexual Activity  . Alcohol use: Yes    Alcohol/week: 1.0 standard drinks    Types: 1 Standard drinks or equivalent per week  . Drug use: No  . Sexual activity: Not on file  Lifestyle  . Physical activity:    Days per week: Not on file    Minutes per  session: Not on file  . Stress: Not on file  Relationships  . Social connections:    Talks on phone: Not on file    Gets together: Not on file    Attends religious service: Not on file    Active member of club or organization: Not on file    Attends meetings of clubs or organizations: Not on file    Relationship status: Not on file  . Intimate partner violence:    Fear of current or ex partner: Not on file    Emotionally abused: Not on  file    Physically abused: Not on file    Forced sexual activity: Not on file  Other Topics Concern  . Not on file  Social History Narrative  . Not on file    Environmental and Social history  Lives in a house with a dry environment, cats and dogs located inside the household, no carpet in the bedroom, plastic on the bed, no plastic on the pillow, no smokers located inside the household.  Objective:   Vitals:   12/05/18 1434  BP: 112/76  Pulse: 64  Resp: 16  Temp: 97.7 F (36.5 C)   Height: 5' 3.5" (161.3 cm) Weight: 162 lb (73.5 kg)  Physical Exam Constitutional:      Appearance: She is not diaphoretic.     Comments: Cough, throat clearing  HENT:     Head: Normocephalic. No right periorbital erythema or left periorbital erythema.     Right Ear: Tympanic membrane, ear canal and external ear normal.     Left Ear: Tympanic membrane, ear canal and external ear normal.     Nose: Nose normal. No mucosal edema or rhinorrhea.     Mouth/Throat:     Pharynx: No oropharyngeal exudate.  Eyes:     General: Lids are normal.     Conjunctiva/sclera: Conjunctivae normal.     Pupils: Pupils are equal, round, and reactive to light.  Neck:     Thyroid: No thyromegaly.     Trachea: Trachea normal. No tracheal deviation.  Cardiovascular:     Rate and Rhythm: Normal rate and regular rhythm.     Heart sounds: Normal heart sounds, S1 normal and S2 normal. No murmur.  Pulmonary:     Effort: Pulmonary effort is normal. No respiratory distress.      Breath sounds: No stridor. No wheezing or rales.  Chest:     Chest wall: No tenderness.  Abdominal:     General: There is no distension.     Palpations: Abdomen is soft. There is no mass.     Tenderness: There is no abdominal tenderness. There is no guarding or rebound.  Musculoskeletal:        General: No tenderness.  Lymphadenopathy:     Head:     Right side of head: No tonsillar adenopathy.     Left side of head: No tonsillar adenopathy.     Cervical: No cervical adenopathy.  Skin:    Coloration: Skin is not pale.     Findings: No erythema or rash.     Nails: There is no clubbing.   Neurological:     Mental Status: She is alert.     Diagnostics: Allergy skin tests were performed.  She demonstrated hypersensitivity to grasses, weeds, trees, house dust mite, and cat.  Spirometry was performed and demonstrated an FEV1 of 1.62 @ 61 % of predicted. FEV1/FVC = 0.86.  Following administration of nebulized albuterol her FEV1 did not improve.  Results of a chest x-ray obtained 29 June 2018 identified the following:  The heart size and mediastinal contours are within normal limits. Both lungs are clear. No pneumothorax or pleural effusion is noted. The visualized skeletal structures are unremarkable.   Assessment and Plan:    1. Not well controlled moderate persistent asthma   2. Chronic pansinusitis   3. LPRD (laryngopharyngeal reflux disease)   4. Immunosuppression ([place])     1.  Allergen avoidance measures  2.  Treat and prevent inflammation:   A.  Symbicort 160 -2 inhalations twice  a day  B.  OTC Nasacort -1 spray each nostril daily  C.  Montelukast 10 mg -1 tablet daily  D. Depomedrol 80 IM delivered in clinic  3.  Treat and prevent reflux:   A.  Consolidate caffeine and chocolate consumption  B.  Omeprazole 40 mg -1 tablet twice a day  4.  Obtain a sinus CT scan for chronic sinusitis  5.  Blood - CBC w/diff, IgA/G/M, anti-pneumo ab, anti-tetanus ab  6.   Return to clinic in 3 weeks or earlier if problem  Mary Odom appears to have significant irritation and inflammation of her airway.  Whether this is based on her atopic disease, a episode of chronic sinusitis, reflux induced respiratory disease, or a combination of these etiologic agents, is not entirely clear.  For now we will treat her with anti-inflammatory agents and therapy directed against reflux and rule out chronic sinusitis.  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  I will see her back in this clinic in 3 weeks to assess her response to this approach.  Jiles Prows, MD Allergy / Immunology [facility]

## 2018-12-06 ENCOUNTER — Telehealth: Payer: Self-pay | Admitting: *Deleted

## 2018-12-06 ENCOUNTER — Encounter: Payer: Self-pay | Admitting: Allergy and Immunology

## 2018-12-06 NOTE — Telephone Encounter (Addendum)
Spoke to [place] at [facility] states no precert is needed for Ct scan. Called and scheduled appt for 12/12/2018 @ 3pm check in 2:45p at [facility] Radiology [facility]

## 2018-12-06 NOTE — Telephone Encounter (Signed)
Called patient left vm to return call we need to advise of appt.

## 2018-12-07 NOTE — Telephone Encounter (Signed)
Patient called and wanted to ask questions about a CT scan that was scheduled for her.

## 2018-12-07 NOTE — Telephone Encounter (Signed)
Spoke to patient she states she cannot make this appt due to daughter having an appt at 330pm. Gave patient number to reschedule appt. Patient will call and reschedule appt

## 2018-12-11 ENCOUNTER — Telehealth: Payer: Self-pay | Admitting: Allergy and Immunology

## 2018-12-11 NOTE — Telephone Encounter (Signed)
Spoke to patient states she cannot come in today. States she will be here with daughter so was wondering if she can see Dr Neldon Mc at the same time advised he did not have anything available but Dr Ernst Bowler did 12/12/2018 @ 3:45p appt scheduled to see him per Dr Neldon Mc patient was ok with this

## 2018-12-11 NOTE — Telephone Encounter (Signed)
Please inform patient that a swollen eye is not a side effect of symbicort. She may have an infection of her eye. Can someone in [place] take a look at her eye today?

## 2018-12-11 NOTE — Telephone Encounter (Signed)
Dr Kozlow please advise 

## 2018-12-11 NOTE — Telephone Encounter (Signed)
Pt came in last week and you put her on symibort and her right eyes is swolling and running water and need to know what to do. [phone].

## 2018-12-12 ENCOUNTER — Encounter: Payer: Self-pay | Admitting: Allergy & Immunology

## 2018-12-12 ENCOUNTER — Ambulatory Visit (INDEPENDENT_AMBULATORY_CARE_PROVIDER_SITE_OTHER): Payer: 59 | Admitting: Allergy & Immunology

## 2018-12-12 ENCOUNTER — Ambulatory Visit (HOSPITAL_COMMUNITY): Payer: 59

## 2018-12-12 VITALS — BP 120/70 | HR 76 | Resp 16

## 2018-12-12 DIAGNOSIS — H5789 Other specified disorders of eye and adnexa: Secondary | ICD-10-CM

## 2018-12-12 LAB — STREP PNEUMONIAE 23 SEROTYPES IGG
PNEUMO AB TYPE 14: 2.7 ug/mL (ref >1.3)
PNEUMO AB TYPE 20: 1 ug/mL — AB (ref >1.3)
PNEUMO AB TYPE 22 (22F): 5.9 ug/mL (ref >1.3)
PNEUMO AB TYPE 23 (23F): 6.4 ug/mL (ref >1.3)
PNEUMO AB TYPE 51 (7F): 8.8 ug/mL (ref >1.3)
PNEUMO AB TYPE 56 (18C): 4.4 ug/mL (ref >1.3)
PNEUMO AB TYPE 70 (33F): 0.6 ug/mL — AB (ref >1.3)
Pneumo Ab Type 1*: 0.5 ug/mL — ABNORMAL LOW (ref >1.3)
Pneumo Ab Type 12 (12F)*: 0.2 ug/mL — ABNORMAL LOW (ref >1.3)
Pneumo Ab Type 17 (17F)*: 0.7 ug/mL — ABNORMAL LOW (ref >1.3)
Pneumo Ab Type 19 (19F)*: 3 ug/mL (ref >1.3)
Pneumo Ab Type 26 (6B)*: 10.4 ug/mL (ref >1.3)
Pneumo Ab Type 3*: 2.7 ug/mL (ref >1.3)
Pneumo Ab Type 34 (10A)*: 3.5 ug/mL (ref >1.3)
Pneumo Ab Type 4*: 0.9 ug/mL — ABNORMAL LOW (ref >1.3)
Pneumo Ab Type 43 (11A)*: 1.5 ug/mL (ref >1.3)
Pneumo Ab Type 5*: 1.1 ug/mL — ABNORMAL LOW (ref >1.3)
Pneumo Ab Type 54 (15B)*: 6.5 ug/mL (ref >1.3)
Pneumo Ab Type 57 (19A)*: 3.7 ug/mL (ref >1.3)
Pneumo Ab Type 68 (9V)*: 1.6 ug/mL (ref >1.3)
Pneumo Ab Type 8*: <0.1 ug/mL — ABNORMAL LOW (ref >1.3)
Pneumo Ab Type 9 (9N)*: 1.5 ug/mL (ref >1.3)

## 2018-12-12 LAB — IGG, IGA, IGM
IgA/Immunoglobulin A, Serum: 126 mg/dL (ref 87–352)
IgG (Immunoglobin G), Serum: 561 mg/dL — ABNORMAL LOW (ref 700–1600)
IgM (Immunoglobulin M), Srm: 24 mg/dL — ABNORMAL LOW (ref 26–217)

## 2018-12-12 LAB — CBC WITH DIFFERENTIAL/PLATELET
Basophils Absolute: 0 10*3/uL (ref 0.0–0.2)
Basos: 1 %
EOS (ABSOLUTE): 0.1 10*3/uL (ref 0.0–0.4)
EOS: 2 %
HEMATOCRIT: 38.4 % (ref 34.0–46.6)
Hemoglobin: 13.2 g/dL (ref 11.1–15.9)
Immature Grans (Abs): 0 10*3/uL (ref 0.0–0.1)
Immature Granulocytes: 0 %
LYMPHS ABS: 1.9 10*3/uL (ref 0.7–3.1)
Lymphs: 25 %
MCH: 30.1 pg (ref 26.6–33.0)
MCHC: 34.4 g/dL (ref 31.5–35.7)
MCV: 88 fL (ref 79–97)
MONOS ABS: 0.6 10*3/uL (ref 0.1–0.9)
Monocytes: 8 %
NEUTROS ABS: 4.8 10*3/uL (ref 1.4–7.0)
Neutrophils: 64 %
Platelets: 324 10*3/uL (ref 150–450)
RBC: 4.39 x10E6/uL (ref 3.77–5.28)
RDW: 12.4 % (ref 11.7–15.4)
WBC: 7.4 10*3/uL (ref 3.4–10.8)

## 2018-12-12 LAB — TETANUS ANTIBODY, IGG: Tetanus Ab, IgG: 3.12 IU/mL (ref <0.10)

## 2018-12-12 NOTE — Patient Instructions (Addendum)
1. Periorbital swelling - Take pictures of the swelling and email me (I promise not show anyone). - [email or web address] - I am not sure what to make of this.   2. Return in about 3 months (around 03/13/2019).   Please inform us of any Emergency Department visits, hospitalizations, or changes in symptoms. Call us before going to the ED for breathing or allergy symptoms since we might be able to fit you in for a sick visit. Feel free to contact us anytime with any questions, problems, or concerns.  It was a pleasure to meet you today!  Websites that have reliable patient information: 1. American Academy of Asthma, Allergy, and Immunology: www.aaaai.org 2. Food Allergy Research and Education (FARE): foodallergy.org 3. Mothers of Asthmatics: http://www.asthmacommunitynetwork.org 4. American College of Allergy, Asthma, and Immunology: MonthlyElectricBill.co.uk   Make sure you are registered to vote! If you have moved or changed any of your contact information, you will need to get this updated before voting!    Voter ID laws are going into effect for the General Election in November 2020! Be prepared! Check out http://levine.[email or web address] for more details.

## 2018-12-12 NOTE — Progress Notes (Signed)
FOLLOW UP  Date of Service/Encounter:  12/12/18   Assessment:   Periorbital swelling    Mary Odom presents for a sick visit due to right sided periorbital edema. There is absolutely nothing present today and even that patient admits this. Apparently it is worse in the mornings and I did ask her to send me some picture so the swelling in the morning. Regardless, I do not think that this is related to her asthma medication at all. I am unsure if this is an environmental allergy trigger at all, and more likely I anticipate that this has something to do with contact dermatitis. She is certainly non toxic appearing and does not have a fever, therefore I do not think that an antibiotic is warranted at this time.   Plan/Recommendations:   1. Periorbital swelling - Take pictures of the swelling and email me (I promise not show anyone). - [email or web address] - I am not sure what to make of this.   2. Return in about 3 months (around 03/13/2019).   Subjective:   Mary Odom is a 58 y.o. female presenting today for follow up of  Chief Complaint  Patient presents with  . Eye Problem    waking up eyes swollen x 4 days     Mary Odom has a history of the following: There are no active problems to display for this patient.   History obtained from: chart review and patient.  Mary Odom Primary Care Provider is Antony Contras, MD.     Mary Odom is a 58 y.o. female presenting for a sick visit. She was last seen as a new patient in January 2020. At that time, she presented with symptoms consistent with asthma and allergic rhinitis. She had testing that was positive to grasses, weeds, trees, dust mite, and cat. She did not have improvement with her albuterol nebulizer treatment, but nonetheless she was started on Symbicort 160/4.5 two puffs BID as well as Nasacort and montelukast. She was also given a DepoMedrol injection of 80mg . She was started on omeprazole 40mg  once daily for her GERD  and reflux precautions were provided.  Since the last visit, she has mostly done well. She did start her Symbicort two puffs BID. However, she reports today that she has developed problems with right sided periorbital edema. This is most prominent in the morning and even she admits that she does not have the swelling present at this time of the afternoon. The swelling is sometimes accompanied by itching, but this is not a prominent symptom. There is tearing during this time, productive of clear fluid. She has had no fevers during time. She denies any problems with photophobia or vision changes at all. These issues have NOT involved her left eye whatsoever. She never had this problem prior to starting the Symbicort, which is why she thinks that the Symbicort is related to her current symptoms.   She does not have a history of contact dermatitis to anything, including makeup. She has had no changes to her cosmetics or her face wash or detergents.   Otherwise, there have been no changes to her past medical history, surgical history, family history, or social history.    Review of Systems: a 14-point review of systems is pertinent for what is mentioned in HPI.  Otherwise, all other systems were negative.  Constitutional: negative other than that listed in the HPI Eyes: negative other than that listed in the HPI Ears, nose, mouth, throat, and face: negative other than that listed  in the HPI Respiratory: negative other than that listed in the HPI Cardiovascular: negative other than that listed in the HPI Gastrointestinal: negative other than that listed in the HPI Genitourinary: negative other than that listed in the HPI Integument: negative other than that listed in the HPI Hematologic: negative other than that listed in the HPI Musculoskeletal: negative other than that listed in the HPI Neurological: negative other than that listed in the HPI Allergy/Immunologic: negative other than that listed in  the HPI    Objective:   Blood pressure 120/70, pulse 76, resp. rate 16, last menstrual period 04/04/2011. There is no height or weight on file to calculate BMI.   Physical Exam:  General: Alert, interactive, in no acute distress. Very pleasant.  Eyes: No conjunctival injection bilaterally, no discharge on the right, no discharge on the left and no Horner-Trantas dots present. PERRL bilaterally. EOMI without pain. No photophobia. No periorbital edema appreciated. She is wearing makeup but there is no sign of contact dermatitis today.  Ears: Right TM pearly gray with normal light reflex, Left TM pearly gray with normal light reflex, Right TM intact without perforation and Left TM intact without perforation.  Nose/Throat: External nose within normal limits and septum midline. Turbinates edematous with clear discharge. Posterior oropharynx erythematous without cobblestoning in the posterior oropharynx. Tonsils 2+ without exudates.  Tongue without thrush. Lungs: Clear to auscultation without wheezing, rhonchi or rales. No increased work of breathing. CV: Normal S1/S2. No murmurs. Capillary refill <2 seconds.  Skin: Warm and dry, without lesions or rashes. Neuro:   Grossly intact. No focal deficits appreciated. Responsive to questions.  Diagnostic studies: none      Mary Marvel, MD  [facility] [place] [facility] [place]

## 2018-12-13 ENCOUNTER — Ambulatory Visit (HOSPITAL_COMMUNITY)
Admission: RE | Admit: 2018-12-13 | Discharge: 2018-12-13 | Disposition: A | Discharge status: Home / Self Care | Payer: 59 | Source: Ambulatory Visit | Attending: Allergy and Immunology | Admitting: Allergy and Immunology

## 2018-12-13 DIAGNOSIS — J324 Chronic pansinusitis: Secondary | ICD-10-CM | POA: Diagnosis not present

## 2018-12-13 DIAGNOSIS — J329 Chronic sinusitis, unspecified: Secondary | ICD-10-CM | POA: Diagnosis not present

## 2019-01-09 ENCOUNTER — Ambulatory Visit (INDEPENDENT_AMBULATORY_CARE_PROVIDER_SITE_OTHER): Payer: 59 | Admitting: Allergy and Immunology

## 2019-01-09 ENCOUNTER — Encounter: Payer: Self-pay | Admitting: Allergy and Immunology

## 2019-01-09 VITALS — BP 118/74 | HR 72 | Temp 97.6°F | Resp 16 | Ht 64.0 in | Wt 155.0 lb

## 2019-01-09 DIAGNOSIS — J4540 Moderate persistent asthma, uncomplicated: Secondary | ICD-10-CM | POA: Diagnosis not present

## 2019-01-09 DIAGNOSIS — K219 Gastro-esophageal reflux disease without esophagitis: Secondary | ICD-10-CM | POA: Diagnosis not present

## 2019-01-09 DIAGNOSIS — J3089 Other allergic rhinitis: Secondary | ICD-10-CM

## 2019-01-09 MED ORDER — AZELASTINE-FLUTICASONE 137-50 MCG/ACT NA SUSP: 1.0000 | Freq: Two times a day (BID) | NASAL | 5 refills | Status: DC

## 2019-01-09 MED ORDER — OMEPRAZOLE 40 MG PO CPDR: 40.0000 mg | DELAYED_RELEASE_CAPSULE | Freq: Every day | ORAL | 5 refills | Status: DC

## 2019-01-09 NOTE — Progress Notes (Signed)
Follow-up Note  Referring Provider: Antony Contras, MD Primary Provider: Antony Contras, MD Date of Office Visit: 01/09/2019  Subjective:   Mary Odom (DOB: Aug 22, 1961) is a 58 y.o. female who returns to the [facility] and [place] on 01/09/2019 in re-evaluation of the following:  HPI: Mary Odom returns to this clinic in evaluation of her persistent cough with a component of asthma and allergic rhinitis and LPR.  I last saw her in this clinic during her initial evaluation of 05 December 2018 at which point in time we made an attempt to address each issue.  Although she is better with her cough she still continues to have a slight cough.  She no longer wakes up at nighttime with a cough but she still has a cough throughout the day.  She does not need to use a short acting bronchodilator.  She does not really have any associated chest tightness or shortness of breath.  She does have mucus stuck in her throat.  She still feels as though her head is a little bit congested.  She can smell and taste and does not have any issues with headaches.  She has no history of ugly nasal discharge or ugly sputum production.  She has eliminated all of her caffeine consumption and has been very good about utilizing therapy directed against reflux.  She continues on a combination of medications directed against respiratory tract inflammation as well.  House dust avoidance measures has been performed.  The cat remains inside the household.  For some reason she had a spontaneous episode of right periorbital swelling that lasted about 2 days without any associated systemic or constitutional symptoms and without an obvious provoking factor on 12 December 2018 that did not require any therapy.  Allergies as of 01/09/2019   No Known Allergies     Medication List      aspirin 81 MG tablet Take 81 mg by mouth daily.   budesonide-formoterol 160-4.5 MCG/ACT inhaler Commonly known as:  SYMBICORT Inhale 2 puffs into the  lungs 2 (two) times daily.   cetirizine 10 MG tablet Commonly known as:  ZYRTEC Take 10 mg by mouth daily.   Fish Oil 1000 MG Caps Take 1 capsule by mouth daily.   gabapentin 300 MG capsule Commonly known as:  NEURONTIN Take 300 mg by mouth at bedtime.   ibuprofen 200 MG tablet Commonly known as:  ADVIL,MOTRIN Take 400 mg by mouth daily.   Lutein-Zeaxanthin 25-5 MG Caps lutein-zeaxanthin   METAMUCIL 0.36 g Caps Generic drug:  Psyllium Metamucil   MIRALAX PO Miralax   modafinil 200 MG tablet Commonly known as:  PROVIGIL Take 200 mg by mouth daily.   montelukast 10 MG tablet Commonly known as:  SINGULAIR   NASACORT ALLERGY 24HR NA Place into the nose.   olopatadine 0.1 % ophthalmic solution Commonly known as:  PATANOL   omeprazole 40 MG capsule Commonly known as:  PRILOSEC Take 1 capsule (40 mg total) by mouth daily.   pyridOXINE 50 MG tablet Commonly known as:  VITAMIN B-6 Take 50 mg by mouth daily.   RITUXAN 100 MG/10ML injection Generic drug:  riTUXimab Rituxan 10 mg/mL concentrate,intravenous  Inject by intravenous route.   simvastatin 20 MG tablet Commonly known as:  ZOCOR Take 20 mg by mouth at bedtime.   temazepam 15 MG capsule Commonly known as:  RESTORIL Take 15 mg by mouth at bedtime as needed for sleep.   Vitamin D (Ergocalciferol) 1.25 MG (50000 UT) Caps  capsule Commonly known as:  DRISDOL Take 2,000 Units by mouth daily.       Past Medical History:  Diagnosis Date  . High cholesterol   . MS (multiple sclerosis) ([place])    Dx 1986    Past Surgical History:  Procedure Laterality Date  . COLONOSCOPY  2012   2 benign polyps  . WISDOM TOOTH EXTRACTION      Review of systems negative except as noted in HPI / PMHx or noted below:  Review of Systems  Constitutional: Negative.   HENT: Negative.   Eyes: Negative.   Respiratory: Negative.   Cardiovascular: Negative.   Gastrointestinal: Negative.   Genitourinary: Negative.     Musculoskeletal: Negative.   Skin: Negative.   Neurological: Negative.   Endo/Heme/Allergies: Negative.   Psychiatric/Behavioral: Negative.      Objective:   Vitals:   01/09/19 1005  BP: 118/74  Pulse: 72  Resp: 16  Temp: 97.6 F (36.4 C)  SpO2: 98%   Height: 5\' 4"  (162.6 cm)  Weight: 155 lb (70.3 kg)   Physical Exam Constitutional:      Appearance: She is not diaphoretic.     Comments: Nasal voice  HENT:     Head: Normocephalic.     Right Ear: Tympanic membrane, ear canal and external ear normal.     Left Ear: Tympanic membrane, ear canal and external ear normal.     Nose: Nose normal. No mucosal edema or rhinorrhea.     Mouth/Throat:     Pharynx: Uvula midline. No oropharyngeal exudate.  Eyes:     Conjunctiva/sclera: Conjunctivae normal.  Neck:     Thyroid: No thyromegaly.     Trachea: Trachea normal. No tracheal tenderness or tracheal deviation.  Cardiovascular:     Rate and Rhythm: Normal rate and regular rhythm.     Heart sounds: Normal heart sounds, S1 normal and S2 normal. No murmur.  Pulmonary:     Effort: No respiratory distress.     Breath sounds: Normal breath sounds. No stridor. No wheezing or rales.  Lymphadenopathy:     Head:     Right side of head: No tonsillar adenopathy.     Left side of head: No tonsillar adenopathy.     Cervical: No cervical adenopathy.  Skin:    Findings: No erythema or rash.     Nails: There is no clubbing.   Neurological:     Mental Status: She is alert.     Diagnostics:    Spirometry was performed and demonstrated an FEV1 of 1.49 at 57 % of predicted.  The patient had an Asthma Control Test with the following results: ACT Total Score: 24.    Results of blood tests obtained 05 December 2018 identified IgG 561 mg/DL, IgM 24 mg/DL, IgA 126 mg/DL, WBC 7.4, absolute eosinophil 100, absolute lymphocyte 1900, hemoglobin 13.2, platelet 324.  Results of a sinus CT scan obtained 13 December 2018 identified the  following:  Paranasal sinuses:  Frontal: Normally aerated. Patent frontal sinus drainage pathways.  Ethmoid: Few scattered small ethmoid opacified air cells, not likely significant.  Maxillary: Left maxillary sinus is clear except for mild focal mucosal thickening in the anteromedial region. Right maxillary sinus shows mild circumferential mucosal thickening. No layering fluid on either side  Sphenoid: Normally aerated. Patent sphenoethmoidal recesses.  Right ostiomeatal unit: Infundibulum is sufficiently patent. Mild mucosal thickening. No unfavorable variant.  Left ostiomeatal unit: Infundibulum is sufficiently patent. No unfavorable variant. Insignificant minor concha bullosa of the middle  turbinate on the left without encroachment.  Nasal passages: Patent. Nasal septum bows 4 mm towards the left with a left spur.  Anatomy: No pneumatization superior to anterior ethmoid notches. Symmetric and intact olfactory grooves and fovea ethmoidalis, Keros I (1-22mm). Sellar sphenoid pneumatization pattern. No dehiscence of carotid or optic canals. No onodi cell.  Other: None. Surrounding soft tissues of the face appear normal. No active dental disease. Other bony structures appear normal. Visualized intracranial contents appear normal.  Assessment and Plan:   1. Asthma, moderate persistent, well-controlled   2. Perennial allergic rhinitis   3. LPRD (laryngopharyngeal reflux disease)     1.  Continue to perform Allergen avoidance measures  2.  Continue to Treat and prevent inflammation:   A.  Symbicort 160 -2 inhalations twice a day  B.  Dymista - 1 spray each nostril two times a day  C.  Montelukast 10 mg -1 tablet daily  3.  Continue to Treat and prevent reflux:   A.  Consolidate caffeine and chocolate consumption  B.  Omeprazole 40 mg -1 tablet twice a day  C.  famotidine 40mg  - 1 tablet in evening  4.  Return to clinic in 8 weeks or earlier if problem  5.  Consider a course of immunotherapy after returning from [place]  It still appears that [place] has some inflammation of her airway which is probably a combination of her atopic disease and reflux and we will treat her with the therapy noted above with the addition of a combination nasal inhaler and a H2 receptor blocker over the course of the next 8 weeks.  I will regroup with her at that point in time to consider further evaluation and treatment.  She is very atopic and I have given her some literature on immunotherapy today and she is presently considering this option of therapy.  Allena Katz, MD Allergy / Immunology [place]

## 2019-01-09 NOTE — Patient Instructions (Addendum)
  1.  Continue to perform Allergen avoidance measures  2.  Continue to Treat and prevent inflammation:   A.  Symbicort 160 -2 inhalations twice a day  B.  Dymista - 1 spray each nostril two times a day  C.  Montelukast 10 mg -1 tablet daily  3.  Continue to Treat and prevent reflux:   A.  Consolidate caffeine and chocolate consumption  B.  Omeprazole 40 mg -1 tablet twice a day  C.  famotidine 40mg  - 1 tablet in evening  4.  Return to clinic in 8 weeks or earlier if problem  5. Consider a course of immunotherapy after returning from [place]

## 2019-01-10 ENCOUNTER — Encounter: Payer: Self-pay | Admitting: Allergy and Immunology

## 2019-01-12 ENCOUNTER — Other Ambulatory Visit: Payer: Self-pay | Admitting: *Deleted

## 2019-01-12 MED ORDER — FAMOTIDINE 40 MG PO TABS: ORAL_TABLET | ORAL | 5 refills | Status: DC

## 2019-01-23 ENCOUNTER — Encounter: Payer: Self-pay | Admitting: Allergy & Immunology

## 2019-01-23 ENCOUNTER — Ambulatory Visit (INDEPENDENT_AMBULATORY_CARE_PROVIDER_SITE_OTHER): Payer: 59 | Admitting: Allergy & Immunology

## 2019-01-23 VITALS — BP 118/78 | HR 62 | Temp 97.9°F | Resp 16

## 2019-01-23 DIAGNOSIS — K219 Gastro-esophageal reflux disease without esophagitis: Secondary | ICD-10-CM

## 2019-01-23 DIAGNOSIS — J4540 Moderate persistent asthma, uncomplicated: Secondary | ICD-10-CM | POA: Diagnosis not present

## 2019-01-23 DIAGNOSIS — J0100 Acute maxillary sinusitis, unspecified: Secondary | ICD-10-CM | POA: Diagnosis not present

## 2019-01-23 DIAGNOSIS — J3089 Other allergic rhinitis: Secondary | ICD-10-CM | POA: Diagnosis not present

## 2019-01-23 MED ORDER — FLUTICASONE PROPIONATE 50 MCG/ACT NA SUSP: 1.0000 | Freq: Two times a day (BID) | NASAL | 2 refills | Status: DC

## 2019-01-23 MED ORDER — AZELASTINE HCL 0.1 % NA SOLN: 1.0000 | Freq: Two times a day (BID) | NASAL | 12 refills | Status: DC

## 2019-01-23 MED ORDER — AMOXICILLIN-POT CLAVULANATE 875-125 MG PO TABS: 1.0000 | ORAL_TABLET | Freq: Two times a day (BID) | ORAL | 0 refills | Status: AC

## 2019-01-23 NOTE — Progress Notes (Signed)
FOLLOW UP  Date of Service/Encounter:  01/23/19   Assessment:   Asthma, moderate persistent, well-controlled  Perennial allergic rhinitis  Acute non-recurrent maxillary sinusitis   GERD    Ms. Odden presents with continued sinus pressure and postnasal drip.  She has had these issues for well over 2 weeks, and I think at this point she warrants treatment for a sinus infection.  She would like to make sure that she is clear from an infection standpoint and her cough is improved prior to her trip to [place].  She is worried that if she is coughing on her way back, she will be quarantined for coronavirus.  We are going to treat aggressively with prednisone as well as Augmentin.  I anticipate that she will do well with this regimen and hopefully be asymptomatic when her trip rolls around.  Plan/Recommendations:   1. Asthma, moderate persistent, well-controlled - We will not make any medication changes. - Continue with Symbicort two puffs twice daily.  - Continue with montelukast 10mg  daily.  - Continue with ProAir two puffs every 4-6 hours as needed.    2. Perennial allergic rhinitis - Continue with Dymista, but we will split into two different sprays to make it cheaper.  3. Acute sinusitis - With your current symptoms and time course, antibiotics are needed: Augmentin 875mg  twice daily for 10 days  - Start the prednisone dose pack.  - Add on nasal saline spray (i.e., Simply Saline) or nasal saline lavage (i.e., NeilMed) as needed prior to medicated nasal sprays. - For thick post nasal drainage, add guaifenesin [phone] mg (Mucinex) twice daily as needed for mucous thinning with adequate hydration to help it work.   4. GERD - Continue omeprazole. - Continue famotidine 40mg   5. Return in about 6 weeks (around 03/06/2019) as scheduled.    Subjective:   Neyah Ellerman is a 58 y.o. female presenting today for follow up of  Chief Complaint  Patient presents with  . Cough  . Nasal  Congestion    Aaniyah Strohm has a history of the following: There are no active problems to display for this patient.   History obtained from: chart review and patient.  Annaleigha is a 58 y.o. female presenting for a follow up visit.  She was last seen February 11.  At that time, her cough was doing better, but have not resolved.  She was no longer waking up at nighttime for the cough, but she was having coughing during the day.  Her spirometry was only 57% predicted with regards to her FEV1.  Dr. Neldon Mc continued Symbicort 160/4.5 mcg 2 puffs twice daily as well as Dymista 1 spray per nostril twice daily and montelukast daily.  Since the last visit, she has continued to do poorly.  In particular, she has had difficulty catching her breath because she is coughing so much.  She reports that she has had marked nasal congestion with sinus pressure and postnasal drip.  She actually did well last week on the day that it snowed, but then once warmed up her symptoms resumed. She has had no fevers. She has not had steroids in quite some time.  She does not remember the last time that she had any kind of antibiotics.  She remains on her Dymista 1 to 2 sprays per nostril up to twice daily.  She is requesting that a prescription for fluticasone and Astelin be sent in since the [place] is too much.  She is looking to start allergen  immunotherapy after her trip to [place].  She is leaving in 2 weeks to go on a church sponsored trip to [place] and [place].  Her reflux remains under good control.  She is on omeprazole as well as famotidine.  Otherwise, there have been no changes to her past medical history, surgical history, family history, or social history.    Review of Systems  Constitutional: Negative.  Negative for fever, malaise/fatigue and weight loss.  HENT: Positive for congestion, sinus pain and sore throat. Negative for ear discharge and ear pain.   Eyes: Negative for pain, discharge and redness.    Respiratory: Positive for cough. Negative for sputum production, shortness of breath and wheezing.   Cardiovascular: Negative.  Negative for chest pain and palpitations.  Gastrointestinal: Negative for abdominal pain and heartburn.  Skin: Negative.  Negative for itching and rash.  Neurological: Negative for dizziness and headaches.  Endo/Heme/Allergies: Negative for environmental allergies. Does not bruise/bleed easily.       Objective:   Blood pressure 118/78, pulse 62, temperature 97.9 F (36.6 C), temperature source Oral, resp. rate 16, last menstrual period 04/04/2011, SpO2 98 %. There is no height or weight on file to calculate BMI.   Physical Exam:  Physical Exam  Constitutional: She appears well-developed.  HENT:  Head: Normocephalic and atraumatic.  Right Ear: Tympanic membrane, external ear and ear canal normal. No drainage, swelling or tenderness. Tympanic membrane is not injected, not scarred, not erythematous, not retracted and not bulging.  Left Ear: Tympanic membrane, external ear and ear canal normal. No drainage, swelling or tenderness. Tympanic membrane is not injected, not scarred, not erythematous, not retracted and not bulging.  Nose: Mucosal edema and rhinorrhea present. No nasal deformity or septal deviation. No epistaxis. Right sinus exhibits maxillary sinus tenderness. Right sinus exhibits no frontal sinus tenderness. Left sinus exhibits maxillary sinus tenderness. Left sinus exhibits no frontal sinus tenderness.  Mouth/Throat: Uvula is midline and oropharynx is clear and moist. Mucous membranes are not pale and not dry.  Turbinate hypertrophy bilaterally with purulent discharge.   Eyes: Pupils are equal, round, and reactive to light. Conjunctivae and EOM are normal. Right eye exhibits no chemosis and no discharge. Left eye exhibits no chemosis and no discharge. Right conjunctiva is not injected. Left conjunctiva is not injected.  Cardiovascular: Normal rate,  regular rhythm and normal heart sounds.  Respiratory: Effort normal and breath sounds normal. No accessory muscle usage. No tachypnea. No respiratory distress. She has no wheezes. She has no rhonchi. She has no rales. She exhibits no tenderness.  GI: There is no abdominal tenderness. There is no rebound and no guarding.  Lymphadenopathy:       Head (right side): No submandibular, no tonsillar and no occipital adenopathy present.       Head (left side): No submandibular, no tonsillar and no occipital adenopathy present.    She has no cervical adenopathy.  Neurological: She is alert.  Skin: No abrasion, no petechiae and no rash noted. Rash is not papular, not vesicular and not urticarial. No erythema. No pallor.  Psychiatric: She has a normal mood and affect.     Diagnostic studies: none     Salvatore Marvel, MD  [facility] [place] [facility] [place]

## 2019-01-23 NOTE — Patient Instructions (Addendum)
1. Asthma, moderate persistent, well-controlled - We will not make any medication changes. - Continue with Symbicort two puffs twice daily.  - Continue with montelukast 10mg  daily.  - Continue with ProAir two puffs every 4-6 hours as needed.    2. Perennial allergic rhinitis - Continue with Dymista, but we will split into two different sprays to make it cheaper.  3. Acute sinusitis - With your current symptoms and time course, antibiotics are needed: Augmentin 875mg  twice daily for 10 days  - Start the prednisone dose pack.  - Add on nasal saline spray (i.e., Simply Saline) or nasal saline lavage (i.e., NeilMed) as needed prior to medicated nasal sprays. - For thick post nasal drainage, add guaifenesin [phone] mg (Mucinex) twice daily as needed for mucous thinning with adequate hydration to help it work.   4. GERD - Continue omeprazole. - Continue famotidine 40mg   5. Return in about 6 weeks (around 03/06/2019) as scheduled.    Please inform us of any Emergency Department visits, hospitalizations, or changes in symptoms. Call us before going to the ED for breathing or allergy symptoms since we might be able to fit you in for a sick visit. Feel free to contact us anytime with any questions, problems, or concerns.  It was a pleasure to see you again today! HAVE A GREAT TIME IN [place]!   Websites that have reliable patient information: 1. American Academy of Asthma, Allergy, and Immunology: www.aaaai.org 2. Food Allergy Research and Education (FARE): foodallergy.org 3. Mothers of Asthmatics: http://www.asthmacommunitynetwork.org 4. American College of Allergy, Asthma, and Immunology: MonthlyElectricBill.co.uk   Make sure you are registered to vote! If you have moved or changed any of your contact information, you will need to get this updated before voting!    Voter ID laws are NOT going into effect for the General Election in November 2020! DO NOT let this stop you from exercising your right to  vote!

## 2019-01-26 ENCOUNTER — Telehealth: Payer: Self-pay | Admitting: Allergy and Immunology

## 2019-01-26 ENCOUNTER — Other Ambulatory Visit: Payer: Self-pay

## 2019-01-26 MED ORDER — OMEPRAZOLE 40 MG PO CPDR: 40.0000 mg | DELAYED_RELEASE_CAPSULE | Freq: Every day | ORAL | 5 refills | Status: DC

## 2019-01-26 NOTE — Telephone Encounter (Signed)
Rx was changed and sent to [facility] to Omeprazole 40 mg bid.

## 2019-01-26 NOTE — Telephone Encounter (Signed)
Patient needs a new script sent in for OMEPRAZOLE Dr Neldon Mc has increased the dose to 2 daily and patient has run out to soon and needs a new script to get a refill - patient has 3 pills left Please sent in new script to [facility]

## 2019-01-29 ENCOUNTER — Other Ambulatory Visit: Payer: Self-pay

## 2019-01-29 MED ORDER — OMEPRAZOLE 40 MG PO CPDR: DELAYED_RELEASE_CAPSULE | ORAL | 5 refills | Status: DC

## 2019-01-30 ENCOUNTER — Telehealth: Payer: Self-pay

## 2019-01-30 NOTE — Telephone Encounter (Signed)
Patient called because she is concerned about rebound symptoms after her illness last week. She would like to know if there is anything that can be sent in for her to take with her.

## 2019-01-31 NOTE — Telephone Encounter (Signed)
She can come by and pick up a prednisone pack to take with her in case her symptoms worsen.  Salvatore Marvel, MD Allergy [facility] [place]

## 2019-01-31 NOTE — Telephone Encounter (Signed)
Let's do a Wednesday pack.  Salvatore Marvel, MD Allergy and [facility] [place]

## 2019-01-31 NOTE — Telephone Encounter (Signed)
Patient called back advised pack left up front for her to pick patient verbalized understanding

## 2019-01-31 NOTE — Telephone Encounter (Signed)
Do we dispense prednisone day pack or baby pack please advise

## 2019-01-31 NOTE — Telephone Encounter (Signed)
LMOM for patient to return call.

## 2019-03-06 ENCOUNTER — Ambulatory Visit: Payer: 59 | Admitting: Allergy and Immunology

## 2019-03-08 DIAGNOSIS — G35 Multiple sclerosis: Secondary | ICD-10-CM | POA: Diagnosis not present

## 2019-03-08 DIAGNOSIS — D72819 Decreased white blood cell count, unspecified: Secondary | ICD-10-CM | POA: Diagnosis not present

## 2019-03-22 DIAGNOSIS — G35 Multiple sclerosis: Secondary | ICD-10-CM | POA: Diagnosis not present

## 2019-04-09 DIAGNOSIS — H1045 Other chronic allergic conjunctivitis: Secondary | ICD-10-CM | POA: Diagnosis not present

## 2019-04-09 DIAGNOSIS — H0102B Squamous blepharitis left eye, upper and lower eyelids: Secondary | ICD-10-CM | POA: Diagnosis not present

## 2019-04-09 DIAGNOSIS — H0102A Squamous blepharitis right eye, upper and lower eyelids: Secondary | ICD-10-CM | POA: Diagnosis not present

## 2019-04-20 DIAGNOSIS — J309 Allergic rhinitis, unspecified: Secondary | ICD-10-CM | POA: Diagnosis not present

## 2019-04-20 DIAGNOSIS — G4700 Insomnia, unspecified: Secondary | ICD-10-CM | POA: Diagnosis not present

## 2019-04-20 DIAGNOSIS — E782 Mixed hyperlipidemia: Secondary | ICD-10-CM | POA: Diagnosis not present

## 2019-04-25 DIAGNOSIS — G35 Multiple sclerosis: Secondary | ICD-10-CM | POA: Diagnosis not present

## 2019-04-25 DIAGNOSIS — H40013 Open angle with borderline findings, low risk, bilateral: Secondary | ICD-10-CM | POA: Diagnosis not present

## 2019-04-25 DIAGNOSIS — H21542 Posterior synechiae (iris), left eye: Secondary | ICD-10-CM | POA: Diagnosis not present

## 2019-05-08 ENCOUNTER — Other Ambulatory Visit: Payer: Self-pay

## 2019-05-08 ENCOUNTER — Ambulatory Visit (INDEPENDENT_AMBULATORY_CARE_PROVIDER_SITE_OTHER): Payer: 59 | Admitting: Allergy and Immunology

## 2019-05-08 ENCOUNTER — Encounter: Payer: Self-pay | Admitting: Allergy and Immunology

## 2019-05-08 VITALS — BP 110/70 | HR 66 | Temp 98.7°F | Resp 16

## 2019-05-08 DIAGNOSIS — J4540 Moderate persistent asthma, uncomplicated: Secondary | ICD-10-CM

## 2019-05-08 DIAGNOSIS — J3089 Other allergic rhinitis: Secondary | ICD-10-CM | POA: Diagnosis not present

## 2019-05-08 DIAGNOSIS — D72810 Lymphocytopenia: Secondary | ICD-10-CM

## 2019-05-08 DIAGNOSIS — H6983 Other specified disorders of Eustachian tube, bilateral: Secondary | ICD-10-CM

## 2019-05-08 DIAGNOSIS — D801 Nonfamilial hypogammaglobulinemia: Secondary | ICD-10-CM

## 2019-05-08 DIAGNOSIS — K219 Gastro-esophageal reflux disease without esophagitis: Secondary | ICD-10-CM

## 2019-05-08 NOTE — Progress Notes (Signed)
[facility] [place] [facility] [employer]   Follow-up Note  Referring Provider: Antony Contras, MD Primary Provider: Antony Contras, MD Date of Office Visit: 05/08/2019  Subjective:   Mary Odom (DOB: 16-Apr-1961) is a 58 y.o. female who returns to the [facility] on 05/08/2019 in re-evaluation of the following:  HPI: Mary Odom returns to this clinic in reevaluation of asthma and allergic rhinoconjunctivitis and LPR.  I last saw her in this clinic on 09 January 2019.  During the interval she had to meet with Dr. Ernst Bowler on 23 January 2019 requiring administration of an antibiotic and a systemic steroid for what appeared to be another respiratory tract infection.  Most recently she has developed some issues with nasal congestion and her left ear has a lot of fullness and she still has a little bit of slight cough that might have become somewhat worse over the course of the past several days.  She has not had any high fever recently.  She continues to utilize a combination of anti-inflammatory agents for her airway including the use of Symbicort and Dymista and she continues to consistently treat reflux with omeprazole and famotidine.  She is receiving rituximab for her MS.  On 05 December 2020 we did check immunoglobulin levels and they were relatively low at that point in time.  She presents today with additional blood testing performed on 08 March 2019 with Dr. Delphia Grates, [facility] neurology, and is found to have 0 CD19 B cells, IgG 561 mg/DL, IgM 25 mg/DL, IgA 108 mg/DL, IgE 26 IU/mL.  Since that blood draw she has had another dose of rituximab.  Allergies as of 05/08/2019   No Known Allergies     Medication List      aspirin 81 MG tablet Take 81 mg by mouth daily.   azelastine 0.1 % nasal spray Commonly known as:  ASTELIN Place 1 spray into both nostrils 2 (two) times daily. Use in each nostril as directed   Azelastine-Fluticasone 137-50  MCG/ACT Susp Place 1 spray into both nostrils 2 (two) times daily.   budesonide-formoterol 160-4.5 MCG/ACT inhaler Commonly known as:  Symbicort Inhale 2 puffs into the lungs 2 (two) times daily.   cetirizine 10 MG tablet Commonly known as:  ZYRTEC Take 10 mg by mouth daily.   famotidine 40 MG tablet Commonly known as:  PEPCID Take one tablet once daily as directed   Fish Oil 1000 MG Caps Take 1 capsule by mouth daily.   fluticasone 50 MCG/ACT nasal spray Commonly known as:  FLONASE Place 1 spray into both nostrils 2 (two) times daily.   gabapentin 300 MG capsule Commonly known as:  NEURONTIN Take 300 mg by mouth at bedtime.   ibuprofen 200 MG tablet Commonly known as:  ADVIL Take 400 mg by mouth daily.   Lutein-Zeaxanthin 25-5 MG Caps lutein-zeaxanthin   Metamucil 0.36 g Caps Generic drug:  Psyllium Metamucil   MIRALAX PO Miralax   modafinil 200 MG tablet Commonly known as:  PROVIGIL Take 200 mg by mouth daily.   montelukast 10 MG tablet Commonly known as:  SINGULAIR   NASACORT ALLERGY 24HR NA Place into the nose.   olopatadine 0.1 % ophthalmic solution Commonly known as:  PATANOL   omeprazole 40 MG capsule Commonly known as:  PRILOSEC Take 1 capsule by mouth twice daily   pyridOXINE 50 MG tablet Commonly known as:  VITAMIN B-6 Take 50 mg by mouth daily.   Rituxan 100  MG/10ML injection Generic drug:  riTUXimab Rituxan 10 mg/mL concentrate,intravenous  Inject by intravenous route.   simvastatin 20 MG tablet Commonly known as:  ZOCOR Take 20 mg by mouth at bedtime.   temazepam 15 MG capsule Commonly known as:  RESTORIL Take 15 mg by mouth at bedtime as needed for sleep.   Vitamin D (Ergocalciferol) 1.25 MG (50000 UT) Caps capsule Commonly known as:  DRISDOL Take 2,000 Units by mouth daily.       Past Medical History:  Diagnosis Date  . High cholesterol   . MS (multiple sclerosis) ([place])    Dx 1986    Past Surgical History:   Procedure Laterality Date  . COLONOSCOPY  2012   2 benign polyps  . WISDOM TOOTH EXTRACTION      Review of systems negative except as noted in HPI / PMHx or noted below:  Review of Systems  Constitutional: Negative.   HENT: Negative.   Eyes: Negative.   Respiratory: Negative.   Cardiovascular: Negative.   Gastrointestinal: Negative.   Genitourinary: Negative.   Musculoskeletal: Negative.   Skin: Negative.   Neurological: Negative.   Endo/Heme/Allergies: Negative.   Psychiatric/Behavioral: Negative.      Objective:   Vitals:   05/08/19 1606  BP: 110/70  Pulse: 66  Resp: 16  Temp: 98.7 F (37.1 C)  SpO2: 97%          Physical Exam Constitutional:      Appearance: She is not diaphoretic.  HENT:     Head: Normocephalic.     Right Ear: Tympanic membrane, ear canal and external ear normal.     Left Ear: Ear canal and external ear normal. Tympanic membrane has decreased mobility (No pneumatic movement).     Nose: Mucosal edema present. No rhinorrhea.     Mouth/Throat:     Pharynx: Uvula midline. No oropharyngeal exudate.  Eyes:     Conjunctiva/sclera: Conjunctivae normal.  Neck:     Thyroid: No thyromegaly.     Trachea: Trachea normal. No tracheal tenderness or tracheal deviation.  Cardiovascular:     Rate and Rhythm: Normal rate and regular rhythm.     Heart sounds: Normal heart sounds, S1 normal and S2 normal. No murmur.  Pulmonary:     Effort: No respiratory distress.     Breath sounds: Normal breath sounds. No stridor. No wheezing or rales.  Lymphadenopathy:     Head:     Right side of head: No tonsillar adenopathy.     Left side of head: No tonsillar adenopathy.     Cervical: No cervical adenopathy.  Skin:    Findings: No erythema or rash.     Nails: There is no clubbing.   Neurological:     Mental Status: She is alert.     Diagnostics:    Spirometry was performed and demonstrated an FEV1 of 1.59 at 60 % of predicted.   Assessment and Plan:    1. Not well controlled moderate persistent asthma   2. Hypogammaglobulinemia (HCC)   3. Lymphopenia   4. Perennial allergic rhinitis   5. LPRD (laryngopharyngeal reflux disease)   6. Dysfunction of both eustachian tubes     1.  Continue to perform Allergen avoidance measures  2.  Continue to Treat and prevent inflammation:   A.  Symbicort 160 -2 inhalations twice a day  B.  Dymista - 1 spray each nostril two times a day  C.  Montelukast 10 mg -1 tablet daily  3.  Continue  to Treat and prevent reflux:   A.  Consolidate caffeine and chocolate consumption  B.  Omeprazole 40 mg -1 tablet twice a day  C.  famotidine 40mg  - 1 tablet in evening  4.  Blood - IgA/G/M, IgG subclasses  5.  Immunoglobulin administration?  6. 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  We will recheck her antibody levels 1 more time and make a decision about proceeding with his immunoglobulin fusion based upon those results.  Allena Katz, MD Allergy / Immunology [place]

## 2019-05-08 NOTE — Patient Instructions (Addendum)
  1.  Continue to perform Allergen avoidance measures  2.  Continue to Treat and prevent inflammation:   A.  Symbicort 160 -2 inhalations twice a day  B.  Dymista - 1 spray each nostril two times a day  C.  Montelukast 10 mg -1 tablet daily  3.  Continue to Treat and prevent reflux:   A.  Consolidate caffeine and chocolate consumption  B.  Omeprazole 40 mg -1 tablet twice a day  C.  famotidine 40mg  - 1 tablet in evening  4.  Blood - IgA/G/M, IgG subclasses  5.  Immunoglobulin administration?  6. Return to clinic in 12 weeks or earlier if problem

## 2019-05-09 ENCOUNTER — Encounter: Payer: Self-pay | Admitting: Allergy and Immunology

## 2019-05-10 LAB — IGG, IGA, IGM
IgA/Immunoglobulin A, Serum: 114 mg/dL (ref 87–352)
IgG (Immunoglobin G), Serum: 576 mg/dL — ABNORMAL LOW (ref 586–1602)
IgM (Immunoglobulin M), Srm: 24 mg/dL — ABNORMAL LOW (ref 26–217)

## 2019-05-10 LAB — IGE: IgE (Immunoglobulin E), Serum: 27 IU/mL (ref 6–495)

## 2019-05-15 ENCOUNTER — Telehealth: Payer: Self-pay

## 2019-05-15 DIAGNOSIS — D801 Nonfamilial hypogammaglobulinemia: Secondary | ICD-10-CM

## 2019-05-15 DIAGNOSIS — B999 Unspecified infectious disease: Secondary | ICD-10-CM

## 2019-05-15 NOTE — Telephone Encounter (Signed)
Dr Kozlow please advise 

## 2019-05-15 NOTE — Telephone Encounter (Signed)
Patient is calling about her lab results.  Please Advise.

## 2019-05-16 NOTE — Telephone Encounter (Signed)
Dr Neldon Mc please provide diagnosis codes for both lab test

## 2019-05-16 NOTE — Telephone Encounter (Signed)
Hypogammaglobulinemia, recurrent infections

## 2019-05-16 NOTE — Telephone Encounter (Signed)
Noted these have been associated and ordered. Given to Rachelle [facility] to have on hand when patient comes in 3 months

## 2019-05-22 ENCOUNTER — Telehealth: Payer: Self-pay | Admitting: Allergy and Immunology

## 2019-05-22 NOTE — Telephone Encounter (Signed)
Millie will reach back out to patient

## 2019-05-22 NOTE — Telephone Encounter (Signed)
Called and spoke to patient advised as written per Dr Neldon Mc. States she will give it another week and if not better reach out to ENT

## 2019-05-22 NOTE — Telephone Encounter (Signed)
Dr Kozlow please advise 

## 2019-05-22 NOTE — Telephone Encounter (Signed)
Please inform patient that if she continues to have problems with her ear then we need to have [facility] ENT take a look at her.  She may benefit from placement of an ear ventilation tube if indeed she has bad eustachian tube dysfunction that may affect hearing.

## 2019-05-22 NOTE — Telephone Encounter (Signed)
Patient was recently seen Was told she had a blockage in her ear during her exam Was told it would work itself out Patient is calling stating that is it still there and is wondering if Dr Neldon Mc has any suggestions on how to clean it out Please call

## 2019-07-06 ENCOUNTER — Other Ambulatory Visit: Payer: Self-pay | Admitting: Allergy and Immunology

## 2019-07-31 ENCOUNTER — Ambulatory Visit (INDEPENDENT_AMBULATORY_CARE_PROVIDER_SITE_OTHER): Payer: 59 | Admitting: Allergy and Immunology

## 2019-07-31 ENCOUNTER — Other Ambulatory Visit: Payer: Self-pay

## 2019-07-31 ENCOUNTER — Encounter: Payer: Self-pay | Admitting: Allergy and Immunology

## 2019-07-31 VITALS — BP 132/74 | HR 64 | Temp 98.3°F | Resp 16

## 2019-07-31 DIAGNOSIS — B999 Unspecified infectious disease: Secondary | ICD-10-CM | POA: Diagnosis not present

## 2019-07-31 DIAGNOSIS — D801 Nonfamilial hypogammaglobulinemia: Secondary | ICD-10-CM | POA: Diagnosis not present

## 2019-07-31 DIAGNOSIS — J3089 Other allergic rhinitis: Secondary | ICD-10-CM

## 2019-07-31 DIAGNOSIS — J4540 Moderate persistent asthma, uncomplicated: Secondary | ICD-10-CM

## 2019-07-31 DIAGNOSIS — K219 Gastro-esophageal reflux disease without esophagitis: Secondary | ICD-10-CM

## 2019-07-31 NOTE — Progress Notes (Signed)
[facility] [place] [facility] [place]   Follow-up Note  Referring Provider: Antony Contras, MD Primary Provider: Antony Contras, MD Date of Office Visit: 07/31/2019  Subjective:   Mary Odom (DOB: [birth year]) is a 58 y.o. female who returns to the [facility] [place] on 07/31/2019 in re-evaluation of the following:  HPI: Emilio returns to this clinic in reevaluation of asthma and allergic rhinoconjunctivitis and LPR and history of recurrent infections in the setting of immunosuppression with rituximab.  I last saw her in this clinic on 08 May 2019.  She required placement of the ear ventilation tube and prolonged antibiotic administration for a left ear infection by Dr. Lucia Gaskins.  Fortunately, after several months of this therapy her ear finally cleared out.  She can now hear without any problem.  She still continues to have a little bit of a lingering cough on occasion.  There is no nocturnal bronchospastic symptoms and no need to use a short acting bronchodilator.  She has very little issues with her nose.  She continues to use Symbicort and montelukast and a nasal steroid.  Her reflux is under very good control at this point in time on her current therapy.  Allergies as of 07/31/2019   No Known Allergies     Medication List    aspirin 81 MG tablet Take 81 mg by mouth daily.   azelastine 0.1 % nasal spray Commonly known as: ASTELIN Place 1 spray into both nostrils 2 (two) times daily. Use in each nostril as directed   cetirizine 10 MG tablet Commonly known as: ZYRTEC Take 10 mg by mouth daily.   famotidine 40 MG tablet Commonly known as: PEPCID Take one tablet once daily as directed   Fish Oil 1000 MG Caps Take 1 capsule by mouth daily.   fluticasone 50 MCG/ACT nasal spray Commonly known as: FLONASE Place 1 spray into both nostrils 2 (two) times daily.   gabapentin 300 MG capsule Commonly known as: NEURONTIN Take 300 mg by mouth at bedtime.    ibuprofen 200 MG tablet Commonly known as: ADVIL Take 400 mg by mouth daily.   Lutein-Zeaxanthin 25-5 MG Caps lutein-zeaxanthin   Metamucil 0.36 g Caps Generic drug: Psyllium Metamucil   MIRALAX PO Miralax   modafinil 200 MG tablet Commonly known as: PROVIGIL Take 200 mg by mouth daily.   montelukast 10 MG tablet Commonly known as: SINGULAIR   olopatadine 0.1 % ophthalmic solution Commonly known as: PATANOL   omeprazole 40 MG capsule Commonly known as: PRILOSEC Take 1 capsule by mouth twice daily   pyridOXINE 50 MG tablet Commonly known as: VITAMIN B-6 Take 50 mg by mouth daily.   Rituxan 100 MG/10ML injection Generic drug: riTUXimab Rituxan 10 mg/mL concentrate,intravenous  Inject by intravenous route.   simvastatin 20 MG tablet Commonly known as: ZOCOR Take 20 mg by mouth at bedtime.   Symbicort 160-4.5 MCG/ACT inhaler Generic drug: budesonide-formoterol USE 2 PUFFS TWICE A DAY.   temazepam 15 MG capsule Commonly known as: RESTORIL Take 15 mg by mouth at bedtime as needed for sleep.   Vitamin D (Ergocalciferol) 1.25 MG (50000 UT) Caps capsule Commonly known as: DRISDOL Take 2,000 Units by mouth daily.       Past Medical History:  Diagnosis Date  . High cholesterol   . MS (multiple sclerosis) ([place])    Dx 1986    Past Surgical History:  Procedure Laterality Date  . COLONOSCOPY  2012   2 benign polyps  .  WISDOM TOOTH EXTRACTION      Review of systems negative except as noted in HPI / PMHx or noted below:  Review of Systems  Constitutional: Negative.   HENT: Negative.   Eyes: Negative.   Respiratory: Negative.   Cardiovascular: Negative.   Gastrointestinal: Negative.   Genitourinary: Negative.   Musculoskeletal: Negative.   Skin: Negative.   Neurological: Negative.   Endo/Heme/Allergies: Negative.   Psychiatric/Behavioral: Negative.      Objective:   Vitals:   07/31/19 1611  BP: 132/74  Pulse: 64  Resp: 16  Temp: 98.3 F  (36.8 C)  SpO2: 100%          Physical Exam Constitutional:      Appearance: She is not diaphoretic.  HENT:     Head: Normocephalic.     Right Ear: Tympanic membrane, ear canal and external ear normal.     Left Ear: Tympanic membrane, ear canal and external ear normal. A PE tube is present.     Nose: Nose normal. No mucosal edema or rhinorrhea.     Mouth/Throat:     Pharynx: Uvula midline. No oropharyngeal exudate.  Eyes:     Conjunctiva/sclera: Conjunctivae normal.  Neck:     Thyroid: No thyromegaly.     Trachea: Trachea normal. No tracheal tenderness or tracheal deviation.  Cardiovascular:     Rate and Rhythm: Normal rate and regular rhythm.     Heart sounds: Normal heart sounds, S1 normal and S2 normal. No murmur.  Pulmonary:     Effort: No respiratory distress.     Breath sounds: Normal breath sounds. No stridor. No wheezing or rales.  Lymphadenopathy:     Head:     Right side of head: No tonsillar adenopathy.     Left side of head: No tonsillar adenopathy.     Cervical: No cervical adenopathy.  Skin:    Findings: No erythema or rash.     Nails: There is no clubbing.   Neurological:     Mental Status: She is alert.     Diagnostics:    Spirometry was performed and demonstrated an FEV1 of 1.67 at 63 % of predicted.  Results of blood tests obtained 08 May 2019 identifies IgG 576 mg/DL, IgM 24 mg/DL, IgA 114 mg/DL, IgE 27 KU/L  Assessment and Plan:   1. Asthma, moderate persistent, well-controlled   2. Hypogammaglobulinemia ([place])   3. Recurrent infections   4. Perennial allergic rhinitis   5. LPRD (laryngopharyngeal reflux disease)     1.  Continue to Treat and prevent inflammation:   A.  Symbicort 160 -2 inhalations twice a day  B.  Fluticasone + Azelastine - 1 spray each nostril two times a day  C.  Montelukast 10 mg -1 tablet daily  3.  Continue to Treat and prevent reflux:   A.  Consolidate caffeine and chocolate consumption  B.  Omeprazole 40 mg  -1 tablet twice a day  C.  famotidine 40mg  - 1 tablet in evening  4.  Blood - IgA/G/M, IgG subclasses, anti-pneum ab, anti-tetanus ab  5.  Obtain fall flu vaccine (and COVID vaccine)  6. Return to clinic in 12 weeks or earlier if problem  Adahlia still has evidence of inflammation affecting her respiratory tract and still has borderline hypogammaglobulinemia secondary to her rituximab infusions.  For now she will remain on a collection of anti-inflammatory agents as noted above and therapy directed against reflux.  We will once again recheck her antibody levels to see  if her humoral immunity is starting to wane with continued use of her rituximab.  I will see her back in this clinic in 12 weeks or earlier if there is a problem.  Allena Katz, MD Allergy / Immunology [place]

## 2019-07-31 NOTE — Patient Instructions (Addendum)
  1.  Continue to Treat and prevent inflammation:   A.  Symbicort 160 -2 inhalations twice a day  B.  Fluticasone + Azelastine - 1 spray each nostril two times a day  C.  Montelukast 10 mg -1 tablet daily  3.  Continue to Treat and prevent reflux:   A.  Consolidate caffeine and chocolate consumption  B.  Omeprazole 40 mg -1 tablet twice a day  C.  famotidine 40mg  - 1 tablet in evening  4.  Blood - IgA/G/M, IgG subclasses, anti-pneum ab, anti-tetanus ab  5.  Obtain fall flu vaccine (and COVID vaccine)  6. Return to clinic in 12 weeks or earlier if problem

## 2019-08-01 ENCOUNTER — Encounter: Payer: Self-pay | Admitting: Allergy and Immunology

## 2019-08-07 ENCOUNTER — Other Ambulatory Visit: Payer: Self-pay | Admitting: Allergy and Immunology

## 2019-08-07 LAB — IGG 1, 2, 3, AND 4
IgG (Immunoglobin G), Serum: 554 mg/dL — ABNORMAL LOW (ref 586–1602)
IgG, Subclass 1: 290 mg/dL (ref 248–810)
IgG, Subclass 2: 173 mg/dL (ref 130–555)
IgG, Subclass 3: 28 mg/dL (ref 15–102)
IgG, Subclass 4: 4 mg/dL (ref 2–96)

## 2019-08-07 LAB — STREP PNEUMONIAE 23 SEROTYPES IGG
Pneumo Ab Type 1*: 1.5 ug/mL (ref >1.3)
Pneumo Ab Type 12 (12F)*: 0.2 ug/mL — ABNORMAL LOW (ref >1.3)
Pneumo Ab Type 14*: 2.1 ug/mL (ref >1.3)
Pneumo Ab Type 17 (17F)*: 0.6 ug/mL — ABNORMAL LOW (ref >1.3)
Pneumo Ab Type 19 (19F)*: 2.6 ug/mL (ref >1.3)
Pneumo Ab Type 2*: 0.2 ug/mL — ABNORMAL LOW (ref >1.3)
Pneumo Ab Type 20*: 1.3 ug/mL — ABNORMAL LOW (ref >1.3)
Pneumo Ab Type 22 (22F)*: 5.4 ug/mL (ref >1.3)
Pneumo Ab Type 23 (23F)*: 5.3 ug/mL (ref >1.3)
Pneumo Ab Type 26 (6B)*: 10.3 ug/mL (ref >1.3)
Pneumo Ab Type 3*: 4.8 ug/mL (ref >1.3)
Pneumo Ab Type 34 (10A)*: 3.1 ug/mL (ref >1.3)
Pneumo Ab Type 4*: 0.8 ug/mL — ABNORMAL LOW (ref >1.3)
Pneumo Ab Type 43 (11A)*: 1.1 ug/mL — ABNORMAL LOW (ref >1.3)
Pneumo Ab Type 5*: 0.9 ug/mL — ABNORMAL LOW (ref >1.3)
Pneumo Ab Type 51 (7F)*: 9.1 ug/mL (ref >1.3)
Pneumo Ab Type 54 (15B)*: 4.7 ug/mL (ref >1.3)
Pneumo Ab Type 56 (18C)*: 3.3 ug/mL (ref >1.3)
Pneumo Ab Type 57 (19A)*: 3.4 ug/mL (ref >1.3)
Pneumo Ab Type 68 (9V)*: 1.4 ug/mL (ref >1.3)
Pneumo Ab Type 70 (33F)*: 0.5 ug/mL — ABNORMAL LOW (ref >1.3)
Pneumo Ab Type 8*: 0.2 ug/mL — ABNORMAL LOW (ref >1.3)
Pneumo Ab Type 9 (9N)*: 1.3 ug/mL — ABNORMAL LOW (ref >1.3)

## 2019-08-07 LAB — IGG, IGA, IGM
IgA/Immunoglobulin A, Serum: 114 mg/dL (ref 87–352)
IgM (Immunoglobulin M), Srm: 24 mg/dL — ABNORMAL LOW (ref 26–217)

## 2019-08-07 LAB — DIPHTHERIA / TETANUS ANTIBODY PANEL
Diphtheria Ab: 0.3 IU/mL (ref <0.10)
Tetanus Ab, IgG: 3.37 IU/mL (ref <0.10)

## 2019-08-14 ENCOUNTER — Encounter: Payer: Self-pay | Admitting: *Deleted

## 2019-08-18 ENCOUNTER — Other Ambulatory Visit: Payer: Self-pay | Admitting: Allergy and Immunology

## 2019-08-24 ENCOUNTER — Telehealth: Payer: Self-pay

## 2019-08-24 NOTE — Telephone Encounter (Signed)
Can you please ask her when the congestion began and if she has any nasal drainage? If she has drainage what color is it. Is her cough productive, what color? Thank you

## 2019-08-24 NOTE — Telephone Encounter (Signed)
Pt is calling stating that she is very congested.  Pt states that she is taking all of her medications as prescribed.  Still using mucinex, cough is getting worse certain times of the day, but congestion is mostly nasal.   Has not been doing sinus rinse, is going to try this.  Pt is asking for antibiotics, but will try the rinse 1st.

## 2019-08-24 NOTE — Telephone Encounter (Signed)
Cough started earlier this week, non-productive.  Nasal drainage is yellow and sometimes clear.

## 2019-08-27 NOTE — Telephone Encounter (Signed)
Can you please call and check on this patient's cough?

## 2019-08-28 DIAGNOSIS — N390 Urinary tract infection, site not specified: Secondary | ICD-10-CM | POA: Insufficient documentation

## 2019-08-28 DIAGNOSIS — N951 Menopausal and female climacteric states: Secondary | ICD-10-CM | POA: Insufficient documentation

## 2019-08-28 NOTE — Telephone Encounter (Addendum)
Call to patient, still stopped up, using decongestants, asking for antibiotics.  Father in law finally passed away.  Pt states that she is still doing the sinus rinses, but still feels bad.  Offered appointment via televisit tomorrow @ [phone].

## 2019-08-28 NOTE — Progress Notes (Signed)
RE: Mary Odom MRN: [id number] DOB: 1961-07-16 Date of Telemedicine Visit: 08/29/2019  Referring provider: Antony Contras, MD Primary care provider: Antony Contras, MD  Chief Complaint: Allergic Rhinitis  (Televisit at home. Patient gave verbal consent to treat and bill insurance for this visit.) and Cough   Telemedicine Follow Up Visit via Telephone: I connected with Mary Odom for a follow up on 08/29/19 by telephone and verified that I am speaking with the correct person using two identifiers.   I discussed the limitations, risks, security and privacy concerns of performing an evaluation and management service by telephone and the availability of in person appointments. I also discussed with the patient that there may be a patient responsible charge related to this service. The patient expressed understanding and agreed to proceed.  Patient is at home history.  Provider is at the office.  Visit start time: 11:30 Visit end time: 11:53  History of Present Illness: She is a 58 y.o. female, who is being followed for asthma, allergic rhinitis, reflux, and recurrent infections. Her previous allergy office visit was on 07/31/2019 with Dr. Neldon Mc. At today's visit, she reports that she began to feel nasal congestion, thick post nasal drainage, and ear fullness that began about 7 days ago. She denies fever, headache, and facial pain. She is not experiencing any colored nasal drainage at this time. She continues Flonase, azelastine, and saline rinses daily. She reports asthma as well controlled and continues Symbicort 160-2 puffs twice a day, montelukast 10 mg once a day, and rarely uses albuterol for rescue. Reflux is reported as well controlled with omeprazole and famotidine. Her current medications are listed in the chart.   Assessment and Plan:  1.  Continue to Treat and prevent inflammation:   A.  Symbicort 160 -2 inhalations twice a day with a spacer  B.  Fluticasone + Azelastine - 1 spray  each nostril two times a day. Continue saline nasal rinse  C.  Montelukast 10 mg -1 tablet daily  D. Prednisone 10 mg tablet. To bring your allergy symptoms under control, take 2 tablets once a day for 4 days, then take 1 tablet on the 5th day, then stop. Call the clinic if your symptome worsen or do not improve.  2.  Continue to Treat and prevent reflux:   A.  Consolidate caffeine and chocolate consumption  B.  Omeprazole 40 mg -1 tablet twice a day  C.  famotidine 40mg  - 1 tablet in evening   3.  Obtain fall flu vaccine  4. Return to clinic in 2 months or earlier if problem  Return in about 2 months (around 10/29/2019), or if symptoms worsen or fail to improve.  Meds ordered this encounter  Medications  . predniSONE (DELTASONE) 10 MG tablet    Sig: Take two tablets once a day for four days, then take one tablet on the fifth day, then stop.    Dispense:  9 tablet    Refill:  0    Medication List:  Current Outpatient Medications  Medication Sig Dispense Refill  . aspirin 81 MG tablet Take 81 mg by mouth daily.    Marland Kitchen azelastine (ASTELIN) 0.1 % nasal spray Place 1 spray into both nostrils 2 (two) times daily. Use in each nostril as directed 30 mL 12  . cetirizine (ZYRTEC) 10 MG tablet Take 10 mg by mouth daily.    . famotidine (PEPCID) 40 MG tablet Take one tablet once daily as directed 30 tablet 5  . fluticasone (  FLONASE) 50 MCG/ACT nasal spray Place 1 spray into both nostrils 2 (two) times daily. 16 g 2  . gabapentin (NEURONTIN) 300 MG capsule Take 300 mg by mouth at bedtime.    Marland Kitchen ibuprofen (ADVIL,MOTRIN) 200 MG tablet Take 400 mg by mouth daily.    . Lutein-Zeaxanthin 25-5 MG CAPS lutein-zeaxanthin    . modafinil (PROVIGIL) 200 MG tablet Take 200 mg by mouth daily.    . montelukast (SINGULAIR) 10 MG tablet     . Omega-3 Fatty Acids (FISH OIL) 1000 MG CAPS Take 1 capsule by mouth daily.    Marland Kitchen omeprazole (PRILOSEC) 40 MG capsule TAKE (1) CAPSULE TWICE DAILY. 60 capsule 5  . PAZEO  0.7 % SOLN     . Polyethylene Glycol 3350 (MIRALAX PO) Miralax    . Psyllium (METAMUCIL) 0.36 g CAPS Metamucil    . pyridOXINE (VITAMIN B-6) 50 MG tablet Take 50 mg by mouth daily.    . riTUXimab (RITUXAN) 100 MG/10ML injection Rituxan 10 mg/mL concentrate,intravenous  Inject by intravenous route.    . simvastatin (ZOCOR) 20 MG tablet Take 20 mg by mouth at bedtime.    . SYMBICORT 160-4.5 MCG/ACT inhaler USE 2 PUFFS TWICE A DAY. 10.2 g 5  . temazepam (RESTORIL) 15 MG capsule Take 15 mg by mouth at bedtime as needed for sleep.    . Vitamin D, Ergocalciferol, (DRISDOL) 50000 UNITS CAPS capsule Take 2,000 Units by mouth daily.    . predniSONE (DELTASONE) 10 MG tablet Take two tablets once a day for four days, then take one tablet on the fifth day, then stop. 9 tablet 0   No current facility-administered medications for this visit.    Allergies: No Known Allergies I reviewed her past medical history, social history, family history, and environmental history and no significant changes have been reported from previous visit on 07/31/2019.  Objective: Physical Exam Not obtained as encounter was done via telephone.   Previous notes and tests were reviewed.  I discussed the assessment and treatment plan with the patient. The patient was provided an opportunity to ask questions and all were answered. The patient agreed with the plan and demonstrated an understanding of the instructions.   The patient was advised to call back or seek an in-person evaluation if the symptoms worsen or if the condition fails to improve as anticipated.  I provided 23 minutes of non-face-to-face time during this encounter.  It was my pleasure to participate in [facility] care today. Please feel free to contact me with any questions or concerns.   Sincerely,  Gareth Morgan, FNP

## 2019-08-28 NOTE — Addendum Note (Signed)
Addended by: Neomia Dear on: 08/28/2019 09:02 AM   Modules accepted: Orders

## 2019-08-29 ENCOUNTER — Encounter: Payer: Self-pay | Admitting: Family Medicine

## 2019-08-29 ENCOUNTER — Other Ambulatory Visit: Payer: Self-pay

## 2019-08-29 ENCOUNTER — Ambulatory Visit (INDEPENDENT_AMBULATORY_CARE_PROVIDER_SITE_OTHER): Payer: 59 | Admitting: Family Medicine

## 2019-08-29 DIAGNOSIS — J45909 Unspecified asthma, uncomplicated: Secondary | ICD-10-CM

## 2019-08-29 DIAGNOSIS — J3089 Other allergic rhinitis: Secondary | ICD-10-CM | POA: Diagnosis not present

## 2019-08-29 DIAGNOSIS — J4540 Moderate persistent asthma, uncomplicated: Secondary | ICD-10-CM

## 2019-08-29 DIAGNOSIS — K219 Gastro-esophageal reflux disease without esophagitis: Secondary | ICD-10-CM | POA: Insufficient documentation

## 2019-08-29 DIAGNOSIS — B999 Unspecified infectious disease: Secondary | ICD-10-CM | POA: Insufficient documentation

## 2019-08-29 DIAGNOSIS — J0100 Acute maxillary sinusitis, unspecified: Secondary | ICD-10-CM | POA: Insufficient documentation

## 2019-08-29 HISTORY — DX: Unspecified asthma, uncomplicated: J45.909

## 2019-08-29 MED ORDER — PREDNISONE 10 MG PO TABS: ORAL_TABLET | ORAL | 0 refills | Status: DC

## 2019-08-29 NOTE — Patient Instructions (Addendum)
  1.  Continue to Treat and prevent inflammation:   A.  Symbicort 160 -2 inhalations twice a day with a spacer  B.  Fluticasone + Azelastine - 1 spray each nostril two times a day. Continue saline nasal rinse  C.  Montelukast 10 mg -1 tablet daily  D. Prednisone 10 mg tablet. To bring your allergy symptoms under control, take 2 tablets once a day for 4 days, then take 1 tablet on the 5th day, then stop. Call the clinic if your symptome worsen or do not improve.   2.  Continue to Treat and prevent reflux:   A.  Consolidate caffeine and chocolate consumption  B.  Omeprazole 40 mg -1 tablet twice a day  C.  famotidine 40mg  - 1 tablet in evening   3.  Obtain fall flu vaccine   4. Return to clinic in 2 months or earlier if problem

## 2019-08-31 ENCOUNTER — Telehealth: Payer: Self-pay | Admitting: Family Medicine

## 2019-08-31 MED ORDER — AMOXICILLIN-POT CLAVULANATE 875-125 MG PO TABS: 1.0000 | ORAL_TABLET | Freq: Two times a day (BID) | ORAL | 0 refills | Status: AC

## 2019-08-31 NOTE — Telephone Encounter (Signed)
Patient reporting that she continues to have nasal congestion and has developed thick green nasal drainage over the last 2 days. She is using her medications appropriately as well as taking a prednisone taper with no improvement in her symptoms. She will begin Augmentin 857 mg- 1 tablet twice a day for the next 10 days. She will call with worsening of symptoms or any questions.

## 2019-09-04 HISTORY — PX: MYRINGOTOMY WITH TUBE PLACEMENT: SHX5663

## 2019-09-29 ENCOUNTER — Encounter (INDEPENDENT_AMBULATORY_CARE_PROVIDER_SITE_OTHER): Payer: Self-pay

## 2019-10-02 ENCOUNTER — Encounter (INDEPENDENT_AMBULATORY_CARE_PROVIDER_SITE_OTHER): Payer: Self-pay | Admitting: Otolaryngology

## 2019-10-02 ENCOUNTER — Ambulatory Visit (INDEPENDENT_AMBULATORY_CARE_PROVIDER_SITE_OTHER): Payer: 59 | Admitting: Otolaryngology

## 2019-10-02 ENCOUNTER — Other Ambulatory Visit: Payer: Self-pay

## 2019-10-02 ENCOUNTER — Encounter (INDEPENDENT_AMBULATORY_CARE_PROVIDER_SITE_OTHER): Payer: Self-pay

## 2019-10-02 VITALS — Temp 97.7°F

## 2019-10-02 DIAGNOSIS — H6521 Chronic serous otitis media, right ear: Secondary | ICD-10-CM

## 2019-10-02 NOTE — Progress Notes (Signed)
HPI: Mary Odom is a 58 y.o. female who returns today for right-sided otitis media. Patient states her symptoms have improved since last visit. Her hearing has improved and pain has decreased but hearing is still diminished compared to the left side. She also endorses occasional pain that is controlled with Ibuprofen. She has seen the immunologist since last being seen for her MS who extended her Ceftin 10 more days (for a total of 21) and added a Z-pak after. She denies any problems with her left ear which I placed a tube in 4 weeks ago. No further complaints today.  Past Medical History:  Diagnosis Date  . Asthma, well controlled 08/29/2019  . High cholesterol   . MS (multiple sclerosis) ([place])    Dx 1986   Past Surgical History:  Procedure Laterality Date  . COLONOSCOPY  2012   2 benign polyps  . MYRINGOTOMY WITH TUBE PLACEMENT Left 09/04/2019  . WISDOM TOOTH EXTRACTION     Social History   Socioeconomic History  . Marital status: Married    Spouse name: Not on file  . Number of children: Not on file  . Years of education: Not on file  . Highest education level: Not on file  Occupational History  . Not on file  Social Needs  . Financial resource strain: Not on file  . Food insecurity    Worry: Not on file    Inability: Not on file  . Transportation needs    Medical: Not on file    Non-medical: Not on file  Tobacco Use  . Smoking status: Never Smoker  . Smokeless tobacco: Never Used  Substance and Sexual Activity  . Alcohol use: Yes    Alcohol/week: 1.0 standard drinks    Types: 1 Standard drinks or equivalent per week  . Drug use: No  . Sexual activity: Not on file  Lifestyle  . Physical activity    Days per week: Not on file    Minutes per session: Not on file  . Stress: Not on file  Relationships  . Social Herbalist on phone: Not on file    Gets together: Not on file    Attends religious service: Not on file    Active member of club or  organization: Not on file    Attends meetings of clubs or organizations: Not on file    Relationship status: Not on file  Other Topics Concern  . Not on file  Social History Narrative  . Not on file   Family History  Problem Relation Age of Onset  . Asthma Daughter   . Asthma Son   . Urticaria Neg Hx   . Eczema Neg Hx   . Allergic rhinitis Neg Hx    Allergies  Allergen Reactions  . Latex    Prior to Admission medications   Medication Sig Start Date End Date Taking? Authorizing Provider  aspirin 81 MG tablet Take 81 mg by mouth daily.   Yes [provider]  azelastine (ASTELIN) 0.1 % nasal spray Place 1 spray into both nostrils 2 (two) times daily. Use in each nostril as directed 01/23/19  Yes Valentina Shaggy, MD  cetirizine (ZYRTEC) 10 MG tablet Take 10 mg by mouth daily.   Yes [provider]  famotidine (PEPCID) 40 MG tablet Take one tablet once daily as directed 01/12/19  Yes Kozlow, Donnamarie Poag, MD  fluticasone (FLONASE) 50 MCG/ACT nasal spray Place 1 spray into both nostrils 2 (two) times  daily. 01/23/19  Yes Valentina Shaggy, MD  gabapentin (NEURONTIN) 300 MG capsule Take 300 mg by mouth at bedtime.   Yes [provider]  ibuprofen (ADVIL,MOTRIN) 200 MG tablet Take 400 mg by mouth daily.   Yes [provider]  Lutein-Zeaxanthin 25-5 MG CAPS lutein-zeaxanthin   Yes [provider]  modafinil (PROVIGIL) 200 MG tablet Take 200 mg by mouth daily.   Yes [provider]  montelukast (SINGULAIR) 10 MG tablet  12/19/18  Yes [provider]  Omega-3 Fatty Acids (FISH OIL) 1000 MG CAPS Take 1 capsule by mouth daily.   Yes [provider]  omeprazole (PRILOSEC) 40 MG capsule TAKE (1) CAPSULE TWICE DAILY. 08/07/19  Yes Kozlow, Donnamarie Poag, MD  PAZEO 0.7 % SOLN  08/07/19  Yes [provider]  Polyethylene Glycol 3350 (MIRALAX PO) Miralax   Yes [provider]  predniSONE (DELTASONE) 10 MG tablet Take two  tablets once a day for four days, then take one tablet on the fifth day, then stop. 08/29/19  Yes Ambs, Kathrine Cords, FNP  Psyllium (METAMUCIL) 0.36 g CAPS Metamucil   Yes [provider]  pyridOXINE (VITAMIN B-6) 50 MG tablet Take 50 mg by mouth daily.   Yes [provider]  riTUXimab (RITUXAN) 100 MG/10ML injection Rituxan 10 mg/mL concentrate,intravenous  Inject by intravenous route.   Yes [provider]  simvastatin (ZOCOR) 20 MG tablet Take 20 mg by mouth at bedtime.   Yes [provider]  SYMBICORT 160-4.5 MCG/ACT inhaler USE 2 PUFFS TWICE A DAY. 08/20/19  Yes Kozlow, Donnamarie Poag, MD  temazepam (RESTORIL) 15 MG capsule Take 15 mg by mouth at bedtime as needed for sleep.   Yes [provider]  Vitamin D, Ergocalciferol, (DRISDOL) 50000 UNITS CAPS capsule Take 2,000 Units by mouth daily.   Yes [provider]     Positive ROS: Otherwise negative  All other systems have been reviewed and were otherwise negative with the exception of those mentioned in the HPI and as above.  Physical Exam: General: Alert, no acute distress Ears: Left TM is clear with myringotomy tube patent, no drainage. Right TM with serous otitis media and poor mobility on pneumatic otoscopy. Neck: No palpable adenopathy or masses  Myringotomy  Date/Time: 10/02/2019 5:21 PM Performed by: Rozetta Nunnery, MD Authorized by: Rozetta Nunnery, MD   Consent:    Consent obtained:  Verbal   Consent given by:  Patient   Risks discussed:  Bleeding and pain   Alternatives discussed:  No treatment Pre-procedure details:    Indications: serous otitis media   Anesthesia:    Anesthesia method:  Topical application   Topical anesthetic:  Phenol (and topical xylocaine in ear canal) Procedure Details:    Location:  Right TM   Hole made in:  Anteroinferior aspect of TM   Tube:  Paparella Type 1 Findings:    Fluid:  Serous fluid Post-procedure details:    Patient  tolerance of procedure:  Tolerated well, no immediate complications    Assessment: Chronic right serous otitis media following acute otitis media  Plan: Right myringotomy and tube was performed in the office today Recommend use of eardrops for the next 3 days and/or until drainage from the tube subsides. She will follow-up in 1 week for recheck.   Radene Journey, MD

## 2019-10-11 ENCOUNTER — Ambulatory Visit (INDEPENDENT_AMBULATORY_CARE_PROVIDER_SITE_OTHER): Payer: 59 | Admitting: Otolaryngology

## 2019-10-11 ENCOUNTER — Encounter (INDEPENDENT_AMBULATORY_CARE_PROVIDER_SITE_OTHER): Payer: Self-pay | Admitting: Otolaryngology

## 2019-10-11 ENCOUNTER — Other Ambulatory Visit: Payer: Self-pay

## 2019-10-11 VITALS — Temp 97.7°F

## 2019-10-11 DIAGNOSIS — H6983 Other specified disorders of Eustachian tube, bilateral: Secondary | ICD-10-CM

## 2019-10-11 NOTE — Progress Notes (Signed)
HPI: Mary Odom is a 58 y.o. female who presents 7 days s/p right M & T in the office.  She has had no drainage from the ear still feels a little full in the right ear.  Past Medical History:  Diagnosis Date  . Asthma, well controlled 08/29/2019  . High cholesterol   . MS (multiple sclerosis) ([place])    Dx 1986   Past Surgical History:  Procedure Laterality Date  . COLONOSCOPY  2012   2 benign polyps  . MYRINGOTOMY WITH TUBE PLACEMENT Left 09/04/2019  . WISDOM TOOTH EXTRACTION     Social History   Socioeconomic History  . Marital status: Married    Spouse name: Not on file  . Number of children: Not on file  . Years of education: Not on file  . Highest education level: Not on file  Occupational History  . Not on file  Social Needs  . Financial resource strain: Not on file  . Food insecurity    Worry: Not on file    Inability: Not on file  . Transportation needs    Medical: Not on file    Non-medical: Not on file  Tobacco Use  . Smoking status: Never Smoker  . Smokeless tobacco: Never Used  Substance and Sexual Activity  . Alcohol use: Yes    Alcohol/week: 1.0 standard drinks    Types: 1 Standard drinks or equivalent per week  . Drug use: No  . Sexual activity: Not on file  Lifestyle  . Physical activity    Days per week: Not on file    Minutes per session: Not on file  . Stress: Not on file  Relationships  . Social Herbalist on phone: Not on file    Gets together: Not on file    Attends religious service: Not on file    Active member of club or organization: Not on file    Attends meetings of clubs or organizations: Not on file    Relationship status: Not on file  Other Topics Concern  . Not on file  Social History Narrative  . Not on file   Family History  Problem Relation Age of Onset  . Asthma Daughter   . Asthma Son   . Urticaria Neg Hx   . Eczema Neg Hx   . Allergic rhinitis Neg Hx    Allergies  Allergen Reactions  . Latex     Prior to Admission medications   Medication Sig Start Date End Date Taking? Authorizing Provider  aspirin 81 MG tablet Take 81 mg by mouth daily.   Yes [provider]  azelastine (ASTELIN) 0.1 % nasal spray Place 1 spray into both nostrils 2 (two) times daily. Use in each nostril as directed 01/23/19  Yes Valentina Shaggy, MD  cetirizine (ZYRTEC) 10 MG tablet Take 10 mg by mouth daily.   Yes [provider]  famotidine (PEPCID) 40 MG tablet Take one tablet once daily as directed 01/12/19  Yes Kozlow, Donnamarie Poag, MD  fluticasone (FLONASE) 50 MCG/ACT nasal spray Place 1 spray into both nostrils 2 (two) times daily. 01/23/19  Yes Valentina Shaggy, MD  gabapentin (NEURONTIN) 300 MG capsule Take 300 mg by mouth at bedtime.   Yes [provider]  ibuprofen (ADVIL,MOTRIN) 200 MG tablet Take 400 mg by mouth daily.   Yes [provider]  Lutein-Zeaxanthin 25-5 MG CAPS lutein-zeaxanthin   Yes [provider]  modafinil (PROVIGIL) 200 MG tablet Take  200 mg by mouth daily.   Yes [provider]  montelukast (SINGULAIR) 10 MG tablet  12/19/18  Yes [provider]  Omega-3 Fatty Acids (FISH OIL) 1000 MG CAPS Take 1 capsule by mouth daily.   Yes [provider]  omeprazole (PRILOSEC) 40 MG capsule TAKE (1) CAPSULE TWICE DAILY. 08/07/19  Yes Kozlow, Donnamarie Poag, MD  PAZEO 0.7 % SOLN  08/07/19  Yes [provider]  Polyethylene Glycol 3350 (MIRALAX PO) Miralax   Yes [provider]  predniSONE (DELTASONE) 10 MG tablet Take two tablets once a day for four days, then take one tablet on the fifth day, then stop. 08/29/19  Yes Ambs, Kathrine Cords, FNP  Psyllium (METAMUCIL) 0.36 g CAPS Metamucil   Yes [provider]  pyridOXINE (VITAMIN B-6) 50 MG tablet Take 50 mg by mouth daily.   Yes [provider]  riTUXimab (RITUXAN) 100 MG/10ML injection Rituxan 10 mg/mL concentrate,intravenous  Inject by intravenous route.   Yes  [provider]  simvastatin (ZOCOR) 20 MG tablet Take 20 mg by mouth at bedtime.   Yes [provider]  SYMBICORT 160-4.5 MCG/ACT inhaler USE 2 PUFFS TWICE A DAY. 08/20/19  Yes Kozlow, Donnamarie Poag, MD  temazepam (RESTORIL) 15 MG capsule Take 15 mg by mouth at bedtime as needed for sleep.   Yes [provider]  Vitamin D, Ergocalciferol, (DRISDOL) 50000 UNITS CAPS capsule Take 2,000 Units by mouth daily.   Yes [provider]     Physical Exam: Ears: Bilateral ear canals are clear.  Both TMs are clear with patent myringotomy tubes in both ears which are dry with clear middle ear space visualized through the opening of the tubes.  On tuning fork testing AC equivocal to [facility] on the right side.   Assessment: S/p placement of right myringotomy tube.  Previous left myringotomy tube placed several months ago.  Plan: Recommend keeping the ears dry. She will follow-up as needed.   Radene Journey, MD

## 2019-10-30 ENCOUNTER — Ambulatory Visit (INDEPENDENT_AMBULATORY_CARE_PROVIDER_SITE_OTHER): Payer: 59 | Admitting: Allergy and Immunology

## 2019-10-30 ENCOUNTER — Encounter: Payer: Self-pay | Admitting: Allergy and Immunology

## 2019-10-30 ENCOUNTER — Other Ambulatory Visit: Payer: Self-pay

## 2019-10-30 VITALS — BP 126/88 | HR 70 | Temp 97.9°F | Resp 16

## 2019-10-30 DIAGNOSIS — K219 Gastro-esophageal reflux disease without esophagitis: Secondary | ICD-10-CM | POA: Diagnosis not present

## 2019-10-30 DIAGNOSIS — D801 Nonfamilial hypogammaglobulinemia: Secondary | ICD-10-CM

## 2019-10-30 DIAGNOSIS — J4540 Moderate persistent asthma, uncomplicated: Secondary | ICD-10-CM | POA: Diagnosis not present

## 2019-10-30 DIAGNOSIS — B999 Unspecified infectious disease: Secondary | ICD-10-CM | POA: Diagnosis not present

## 2019-10-30 DIAGNOSIS — J3089 Other allergic rhinitis: Secondary | ICD-10-CM

## 2019-10-30 DIAGNOSIS — D849 Immunodeficiency, unspecified: Secondary | ICD-10-CM

## 2019-10-30 NOTE — Patient Instructions (Addendum)
  1.  Continue to Treat and prevent inflammation:   A.  Symbicort 160 -2 inhalations twice a day with a spacer  B.  Fluticasone + Azelastine - 1 spray each nostril two times a day.     C.  Montelukast 10 mg -1 tablet daily  2.  Continue to Treat and prevent reflux:   A.  Consolidate caffeine and chocolate consumption  B.  famotidine 40mg  - 1 tablet in evening  3. If needed:   A. Albuterol HFA - 2 inhalations every 4-6 hours  B. Nasal saline  4.  Obtain Covid vaccine when available  5. Have immunologist obtain blood tests 2021 - immunoglobulin infusions?   6. Obtain a chest X-ray  7. Return to clinic in 12 weeks or earlier if problem

## 2019-10-30 NOTE — Progress Notes (Signed)
[facility] - [place] [facility] [place]   Follow-up Note  Referring Provider: Antony Contras, MD Primary Provider: Antony Contras, MD Date of Office Visit: 10/30/2019  Subjective:   Mary Odom (DOB: 1961-04-24) is a 58 y.o. 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  I last saw her in this clinic on 31 July 2019 and she did visit with our nurse practitioner on 29 August 2019 for what appeared to be a respiratory track flareup possibly associated with a viral respiratory tract infection.  Since I have seen her in this clinic she has continued to have problems with ETD and otitis media and she has required placement of an ear ventilation tube on the left on 04 September 2019 and subsequently placement of an ear ventilation tube on the right on 18 September 2019.  As well, she was using prolonged antibiotic administration including initially Augmentin, followed by Ceftin and is currently using azithromycin 3 times per week on a chronic basis.  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  She has apparently had a E - med visit with an immunologist who works within the neurologist office who is prescribing the rituximab for MS.  At this point in time there has not been a move to provide either subcutaneous or intravenous immunoglobulin administration but she was placed on azithromycin 3 times per week for "preventative" antibiotic therapy recently.  Currently she is doing very well.  She has very little complaints  about her nose or her ears or her throat or her chest and does not really have a significant amount of coughing and does not have shortness of breath and does not use a short acting bronchodilator.  She continues to use a collection of anti-inflammatory agents for her airway including use of Symbicort and nasal fluticasone.  Her reflux appears to be under very good control with the use of famotidine and she is somewhat careful about caffeine and chocolate consumption.  She no longer requires any omeprazole.  Allergies as of 10/30/2019      Reactions   Latex       Medication List    aspirin 81 MG tablet Take 81 mg by mouth daily.   azelastine 0.05 % ophthalmic solution Commonly known as: OPTIVAR   azelastine 0.1 % nasal spray Commonly known as: ASTELIN Place 1 spray into both nostrils 2 (two) times daily. Use in each nostril as directed   azithromycin 500 MG tablet Commonly known as: ZITHROMAX   cetirizine 10 MG tablet Commonly known as: ZYRTEC Take 10 mg by mouth daily.   famotidine 40 MG tablet Commonly known as: PEPCID Take one tablet once daily as directed   Fish Oil 1000 MG Caps Take 1 capsule by mouth daily.   fluticasone 50 MCG/ACT nasal spray Commonly known as: FLONASE Place 1 spray into both nostrils 2 (two) times daily.   gabapentin 300 MG capsule Commonly known as: NEURONTIN Take 300 mg by mouth at bedtime.   ibuprofen 200 MG tablet Commonly known as: ADVIL Take 400 mg by mouth daily.   Lutein-Zeaxanthin 25-5  MG Caps lutein-zeaxanthin   Metamucil 0.36 g Caps Generic drug: Psyllium Metamucil   MIRALAX PO Miralax   modafinil 200 MG tablet Commonly known as: PROVIGIL Take 200 mg by mouth daily.   montelukast 10 MG tablet Commonly known as: SINGULAIR   Pazeo 0.7 % Soln Generic drug: Olopatadine HCl   predniSONE 10 MG tablet Commonly known as: DELTASONE Take two tablets once a day for four days, then take one tablet on the fifth day, then  stop.   pyridOXINE 50 MG tablet Commonly known as: VITAMIN B-6 Take 50 mg by mouth daily.   Rituxan 100 MG/10ML injection Generic drug: riTUXimab Rituxan 10 mg/mL concentrate,intravenous  Inject by intravenous route.   simvastatin 20 MG tablet Commonly known as: ZOCOR Take 20 mg by mouth at bedtime.   Symbicort 160-4.5 MCG/ACT inhaler Generic drug: budesonide-formoterol USE 2 PUFFS TWICE A DAY.   temazepam 15 MG capsule Commonly known as: RESTORIL Take 15 mg by mouth at bedtime as needed for sleep.   Vitamin D (Ergocalciferol) 1.25 MG (50000 UT) Caps capsule Commonly known as: DRISDOL Take 2,000 Units by mouth daily.       Past Medical History:  Diagnosis Date   Asthma, well controlled 08/29/2019   High cholesterol    MS (multiple sclerosis) ([place])    Dx 1986    Past Surgical History:  Procedure Laterality Date   COLONOSCOPY  2012   2 benign polyps   MYRINGOTOMY WITH TUBE PLACEMENT Left 09/04/2019   WISDOM TOOTH EXTRACTION      Review of systems negative except as noted in HPI / PMHx or noted below:  Review of Systems  Constitutional: Negative.   HENT: Negative.   Eyes: Negative.   Respiratory: Negative.   Cardiovascular: Negative.   Gastrointestinal: Negative.   Genitourinary: Negative.   Musculoskeletal: Negative.   Skin: Negative.   Neurological: Negative.   Endo/Heme/Allergies: Negative.   Psychiatric/Behavioral: Negative.      Objective:   Vitals:   10/30/19 1526  BP: 126/88  Pulse: 70  Resp: 16  Temp: 97.9 F (36.6 C)  SpO2: 99%          Physical Exam Constitutional:      Appearance: She is not diaphoretic.  HENT:     Head: Normocephalic.     Right Ear: Tympanic membrane, ear canal and external ear normal. A PE tube is present.     Left Ear: Tympanic membrane, ear canal and external ear normal. A PE tube is present.     Nose: Nose normal. No mucosal edema or rhinorrhea.     Mouth/Throat:     Pharynx: Uvula midline. No  oropharyngeal exudate.  Eyes:     Conjunctiva/sclera: Conjunctivae normal.  Neck:     Thyroid: No thyromegaly.     Trachea: Trachea normal. No tracheal tenderness or tracheal deviation.  Cardiovascular:     Rate and Rhythm: Normal rate and regular rhythm.     Heart sounds: Normal heart sounds, S1 normal and S2 normal. No murmur.  Pulmonary:     Effort: No respiratory distress.     Breath sounds: Normal breath sounds. No stridor. No wheezing or rales.  Lymphadenopathy:     Head:     Right side of head: No tonsillar adenopathy.     Left side of head: No tonsillar adenopathy.     Cervical: No cervical adenopathy.  Skin:    Findings: No erythema or rash.     Nails: There is no clubbing.  Neurological:     Mental Status: She is alert.     Diagnostics:    Spirometry was performed and demonstrated an FEV1 of 1.44 at 55 % of predicted.  Results of blood tests obtained 31 July 2019 identified IgG 554 mg/DL, IgM 24 mg/DL, IgA 114 mg/DL, IgG subclass 1 290 mg/DL, IgG subclass 2 173 mg/DL, IgG subclass 328 mg/DL, IgG subclass 4 4 mg/DL, adequate levels of antibodies directed against multiple serotypes of pneumococcus on a Pneumo 23 serotypes antibiotic panel, tetanus IgG antibody 3.37 IU/mL.  Results of a chest x-ray obtained 29 June 2018 identified the following:  The heart size and mediastinal contours are within normal limits. Both lungs are clear. No pneumothorax or pleural effusion is noted. The visualized skeletal structures are unremarkable.  Assessment and Plan:   1. Asthma, moderate persistent, well-controlled   2. Perennial allergic rhinitis   3. LPRD (laryngopharyngeal reflux disease)   4. 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 Immunosuppression ([place])     1.  Continue to Treat and prevent inflammation:   A.  Symbicort 160 -2 inhalations twice a day with a spacer  B.  Fluticasone + Azelastine - 1 spray each nostril two times a day.     C.   Montelukast 10 mg -1 tablet daily  2.  Continue to Treat and prevent reflux:   A.  Consolidate caffeine and chocolate consumption  B.  famotidine 40mg  - 1 tablet in evening  3. If needed:   A. Albuterol HFA - 2 inhalations every 4-6 hours  B. Nasal saline  4.  Obtain Covid vaccine when available  5.  Have immunologist obtain blood tests 2021 - immunoglobulin infusions?   6.  Obtain a chest X-ray  7.  Return to clinic in 12 weeks or earlier if problem  Aireona appears to be doing okay at this point in time on her current therapy which includes anti-inflammatory medications delivered to her airway and therapy directed against reflux.  She does have an abnormal spirometry which has been a persistent issue now for the past year or so and I would like to follow-up this up with a chest x-ray just to make sure were not dealing with some type of interstitial lung disease in the setting of her immunosuppression and hypogammaglobulinemia.  There is no doubt that rituximab is suppressing B-cell function and in fact she probably has no B cells left in her body at this point in time.  Interestingly, she still has adequate levels of antigen specific antibodies as assessed by her pneumococcal and tetanus antibody levels.  It will be somewhat difficult to get insurance approval for immunoglobulin infusion with these levels of antigen specific antibodies and there has not really been a significant respiratory tract infection other than those infections associated with her ETD in the recent past.  She will be visiting with the immunologist that works out of the neurologist office who is providing rituximab and I have given her a copy of her blood test that we obtained in September to take to that upcoming visit with the immunologist.  I will contact her with the results of her chest x-ray when available.  Allena Katz, MD Allergy / Immunology [place]

## 2019-10-31 ENCOUNTER — Encounter: Payer: Self-pay | Admitting: Allergy and Immunology

## 2019-10-31 ENCOUNTER — Ambulatory Visit
Admission: RE | Admit: 2019-10-31 | Discharge: 2019-10-31 | Disposition: A | Discharge status: Home / Self Care | Payer: 59 | Source: Ambulatory Visit | Attending: Allergy and Immunology | Admitting: Allergy and Immunology

## 2020-02-04 ENCOUNTER — Other Ambulatory Visit: Payer: Self-pay | Admitting: Allergy and Immunology

## 2020-02-15 ENCOUNTER — Other Ambulatory Visit: Payer: Self-pay | Admitting: Allergy and Immunology

## 2020-02-21 ENCOUNTER — Ambulatory Visit: Payer: 59

## 2020-03-03 ENCOUNTER — Other Ambulatory Visit: Payer: Self-pay | Admitting: Allergy and Immunology

## 2020-03-19 ENCOUNTER — Other Ambulatory Visit: Payer: Self-pay | Admitting: Allergy and Immunology

## 2020-03-25 ENCOUNTER — Other Ambulatory Visit: Payer: Self-pay

## 2020-03-25 ENCOUNTER — Ambulatory Visit (INDEPENDENT_AMBULATORY_CARE_PROVIDER_SITE_OTHER): Payer: 59 | Admitting: Allergy and Immunology

## 2020-03-25 ENCOUNTER — Encounter: Payer: Self-pay | Admitting: Allergy and Immunology

## 2020-03-25 VITALS — BP 126/82 | HR 74 | Temp 97.3°F | Resp 16 | Ht 64.0 in | Wt 171.2 lb

## 2020-03-25 DIAGNOSIS — J4540 Moderate persistent asthma, uncomplicated: Secondary | ICD-10-CM

## 2020-03-25 DIAGNOSIS — J3089 Other allergic rhinitis: Secondary | ICD-10-CM

## 2020-03-25 DIAGNOSIS — D849 Immunodeficiency, unspecified: Secondary | ICD-10-CM

## 2020-03-25 DIAGNOSIS — K219 Gastro-esophageal reflux disease without esophagitis: Secondary | ICD-10-CM | POA: Diagnosis not present

## 2020-03-25 MED ORDER — MONTELUKAST SODIUM 10 MG PO TABS: 10.0000 mg | ORAL_TABLET | Freq: Every day | ORAL | 5 refills | Status: DC

## 2020-03-25 MED ORDER — ALBUTEROL SULFATE HFA 108 (90 BASE) MCG/ACT IN AERS: INHALATION_SPRAY | RESPIRATORY_TRACT | 1 refills | Status: DC

## 2020-03-25 NOTE — Patient Instructions (Addendum)
  1.  Continue to Treat and prevent inflammation:   A.  Symbicort 160 -2 inhalations twice a day with a spacer  B.  Fluticasone + Azelastine - 1 spray each nostril two times a day.     C.  Montelukast 10 mg -1 tablet daily  2.  Continue to Treat and prevent reflux:   A.  Consolidate caffeine and chocolate consumption  B.  famotidine 40mg  - 1 tablet in evening  3. If needed:   A. Albuterol HFA - 2 inhalations every 4-6 hours  B. Nasal saline  4. Return to clinic in 6 months or earlier if problem

## 2020-03-25 NOTE — Progress Notes (Signed)
[facility] [place] [facility] [place]   Follow-up Note  Referring Provider: Antony Contras, MD Primary Provider: Antony Contras, MD Date of Office Visit: 03/25/2020  Subjective:   Mary Odom (DOB: 1961/10/12) is a 59 y.o. 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  Her last visit to this clinic was 30 October 2019.  She has really done very well with her respiratory tract issue and has not required either an antibiotic or systemic steroid to treat any type of airway issue.  She has not been exercising.  She does not really have a requirement for using a short acting bronchodilator.  She can smell and taste with no problem.  She has been consistently using Symbicort and nasal fluticasone and montelukast.  Her reflux is under very good control while using famotidine.  She did receive 2 [employer] Covid vaccinations.  Her next rituximab infusion series will start on 03 Apr 2020.  She receives these 2 infusion series every 6 months.  She does have a follow-up appointment to see the immunologist that works in conjunction with her neurologist prescribing these rituximab infusions.  Currently the immunologist has her utilizing azithromycin 3 times per week.  Allergies as of 03/25/2020      Reactions   Latex       Medication List    aspirin 81 MG tablet Take 81 mg by mouth daily.   azelastine 0.05 % ophthalmic solution Commonly known as: OPTIVAR   azelastine 0.1 % nasal spray Commonly known as: ASTELIN Place 1 spray into both nostrils 2 (two) times daily. Use in each nostril as directed   cetirizine 10 MG tablet Commonly known as: ZYRTEC Take 10 mg by mouth daily.   famotidine 40 MG tablet Commonly known  as: PEPCID TAKE 1 TABLET DAILY AS DIRECTED.   Fish Oil 1000 MG Caps Take 1 capsule by mouth daily.   fluticasone 50 MCG/ACT nasal spray Commonly known as: FLONASE Place 1 spray into both nostrils 2 (two) times daily.   gabapentin 300 MG capsule Commonly known as: NEURONTIN Take 300 mg by mouth at bedtime.   ibuprofen 200 MG tablet Commonly known as: ADVIL Take 400 mg by mouth daily.   Lutein-Zeaxanthin 25-5 MG Caps lutein-zeaxanthin   Metamucil 0.36 g Caps Generic drug: Psyllium Metamucil   MIRALAX PO Miralax   modafinil 200 MG tablet Commonly known as: PROVIGIL Take 200 mg by mouth daily.   montelukast 10 MG tablet Commonly known as: SINGULAIR   omeprazole 20 MG capsule Commonly known as: PRILOSEC omeprazole 20 mg capsule,delayed release   Pazeo 0.7 % Soln Generic drug: Olopatadine HCl   predniSONE 10 MG tablet Commonly known as: DELTASONE Take two tablets once a day for four days, then take one tablet on the fifth day, then stop.   pyridOXINE 50 MG tablet Commonly known as: VITAMIN B-6 Take 50 mg by mouth daily.   Rituxan 100 MG/10ML injection Generic drug: riTUXimab Rituxan 10 mg/mL concentrate,intravenous  Inject by intravenous route.   simvastatin 20 MG tablet Commonly known as: ZOCOR Take 20 mg by mouth at bedtime.   Symbicort 160-4.5 MCG/ACT inhaler Generic drug: budesonide-formoterol USE 2 PUFFS TWICE A DAY.   temazepam 15 MG capsule Commonly known as: RESTORIL Take 15  mg by mouth at bedtime as needed for sleep.   Vitamin D (Ergocalciferol) 1.25 MG (50000 UNIT) Caps capsule Commonly known as: DRISDOL Take 2,000 Units by mouth daily.       Past Medical History:  Diagnosis Date  . Asthma, well controlled 08/29/2019  . High cholesterol   . MS (multiple sclerosis) ([place])    Dx 1986    Past Surgical History:  Procedure Laterality Date  . COLONOSCOPY  2012   2 benign polyps  . MYRINGOTOMY WITH TUBE PLACEMENT Left 09/04/2019  .  WISDOM TOOTH EXTRACTION      Review of systems negative except as noted in HPI / PMHx or noted below:  Review of Systems  Constitutional: Negative.   HENT: Negative.   Eyes: Negative.   Respiratory: Negative.   Cardiovascular: Negative.   Gastrointestinal: Negative.   Genitourinary: Negative.   Musculoskeletal: Negative.   Skin: Negative.   Neurological: Negative.   Endo/Heme/Allergies: Negative.   Psychiatric/Behavioral: Negative.      Objective:   Vitals:   03/25/20 1432  BP: 126/82  Pulse: 74  Resp: 16  Temp: (!) 97.3 F (36.3 C)  SpO2: 96%   Height: 5\' 4"  (162.6 cm)  Weight: 171 lb 3.2 oz (77.7 kg)   Physical Exam Constitutional:      Appearance: She is not diaphoretic.  HENT:     Head: Normocephalic.     Right Ear: Tympanic membrane, ear canal and external ear normal. A PE tube is present.     Left Ear: Tympanic membrane, ear canal and external ear normal. A PE tube is present.     Nose: Nose normal. No mucosal edema or rhinorrhea.     Mouth/Throat:     Pharynx: Uvula midline. No oropharyngeal exudate.  Eyes:     Conjunctiva/sclera: Conjunctivae normal.  Neck:     Thyroid: No thyromegaly.     Trachea: Trachea normal. No tracheal tenderness or tracheal deviation.  Cardiovascular:     Rate and Rhythm: Normal rate and regular rhythm.     Heart sounds: Normal heart sounds, S1 normal and S2 normal. No murmur.  Pulmonary:     Effort: No respiratory distress.     Breath sounds: Normal breath sounds. No stridor. No wheezing or rales.  Lymphadenopathy:     Head:     Right side of head: No tonsillar adenopathy.     Left side of head: No tonsillar adenopathy.     Cervical: No cervical adenopathy.  Skin:    Findings: No erythema or rash.     Nails: There is no clubbing.  Neurological:     Mental Status: She is alert.     Diagnostics:    Spirometry was performed and demonstrated an FEV1 of 1.47 at 57 % of predicted.   Results of a chest x-ray obtained  31 October 2019 identified the following:  Mediastinum and hilar structures normal. Mild atelectatic changes noted over the lung bases on lateral view only. No pleural effusion or pneumothorax. Heart size normal. Thoraco lumbar spine scoliosis. No acute bony abnormality.  Assessment and Plan:   1. Asthma, moderate persistent, well-controlled   2. Perennial allergic rhinitis   3. LPRD (laryngopharyngeal reflux disease)   4. Immunosuppression ([place])     1.  Continue to Treat and prevent inflammation:   A.  Symbicort 160 -2 inhalations twice a day with a spacer  B.  Fluticasone + Azelastine - 1 spray each nostril two times a day.  C.  Montelukast 10 mg -1 tablet daily  2.  Continue to Treat and prevent reflux:   A.  Consolidate caffeine and chocolate consumption  B.  famotidine 40mg  - 1 tablet in evening  3. If needed:   A. Albuterol HFA - 2 inhalations every 4-6 hours  B. Nasal saline  4. Return to clinic in 6 months or earlier if problem  Kilee appears to be doing pretty well regarding her respiratory tract and reflux issue on her current medical therapy which includes a collection of anti-inflammatory agents directed against respiratory tract inflammation and consistent use of an H2 receptor blocker.  I have encouraged her to make sure that she has close follow-up with her neurologist and immunologist while she continues to use rituximab.  It sounds as though there is a long-term plan for continued rituximab use and it is quite possible at some point that she is going to develop a clinically significant immunosuppressive state with prolonged use of this biological agents.  I will see her back in this clinic in 6 months or earlier if there is a problem.  Allena Katz, MD Allergy / Immunology [place]

## 2020-03-26 ENCOUNTER — Encounter: Payer: Self-pay | Admitting: Allergy and Immunology

## 2020-04-07 ENCOUNTER — Other Ambulatory Visit: Payer: Self-pay | Admitting: Psychiatry

## 2020-04-07 DIAGNOSIS — G35 Multiple sclerosis: Secondary | ICD-10-CM

## 2020-04-08 ENCOUNTER — Encounter: Payer: Self-pay | Admitting: Allergy and Immunology

## 2020-04-08 ENCOUNTER — Telehealth: Payer: Self-pay | Admitting: Allergy and Immunology

## 2020-04-08 DIAGNOSIS — D849 Immunodeficiency, unspecified: Secondary | ICD-10-CM

## 2020-04-08 NOTE — Telephone Encounter (Signed)
Patient called to see if she should get a pneumonia shot and do we do them here.[phone].

## 2020-04-08 NOTE — Telephone Encounter (Signed)
Dr. Neldon Mc please advise. I was reading your result note to her labs from September of 2020 and at the time her levels were stable and you wanted to repeat blood work in 6 months. Would you like to do this first before administering the pneumovax?

## 2020-04-14 ENCOUNTER — Encounter: Payer: Self-pay | Admitting: *Deleted

## 2020-04-14 NOTE — Telephone Encounter (Signed)
A MyChart message has been sent to the patient since she was communicating through that form of contact. Will follow up with her.

## 2020-04-14 NOTE — Telephone Encounter (Signed)
I do not believe that she has received the Pneumovax vaccine to date.  Is that correct?  I think the best way to approach this issue is to obtain the Pneumo 23 titer study, then receive a Pneumovax, then obtain the Pneumo 23 titer study 6 weeks later.

## 2020-04-17 NOTE — Addendum Note (Signed)
Addended by: Chip Boer R on: 04/17/2020 11:26 AM   Modules accepted: Orders

## 2020-04-17 NOTE — Telephone Encounter (Signed)
Patient spoke with her physician to determine if it was ok to get the Pneumovax vaccine to ensure it would not be a contraindication with other medications. Patient's physician verbalized that it is ok for her to get the Pneumovax. She has been placed on the schedule for Wednesday May 26th to come in and receive the Pneumovax. Labs have been ordered for the patient to do repeat titers 6 weeks from the PNeumovax injection and have been given to [employer].

## 2020-04-18 ENCOUNTER — Encounter: Payer: Self-pay | Admitting: Allergy and Immunology

## 2020-04-18 ENCOUNTER — Other Ambulatory Visit: Payer: Self-pay | Admitting: Allergy and Immunology

## 2020-04-23 ENCOUNTER — Other Ambulatory Visit: Payer: Self-pay

## 2020-04-23 ENCOUNTER — Ambulatory Visit (INDEPENDENT_AMBULATORY_CARE_PROVIDER_SITE_OTHER): Payer: 59

## 2020-04-23 DIAGNOSIS — Z23 Encounter for immunization: Secondary | ICD-10-CM

## 2020-04-23 DIAGNOSIS — D849 Immunodeficiency, unspecified: Secondary | ICD-10-CM

## 2020-04-23 DIAGNOSIS — B999 Unspecified infectious disease: Secondary | ICD-10-CM | POA: Diagnosis not present

## 2020-04-23 NOTE — Progress Notes (Signed)
Patient received Pneumovax 23 win right deltoid. She waited 30 minutes in office with no problems following injection.

## 2020-05-06 ENCOUNTER — Other Ambulatory Visit: Payer: Self-pay

## 2020-05-06 ENCOUNTER — Ambulatory Visit
Admission: RE | Admit: 2020-05-06 | Discharge: 2020-05-06 | Disposition: A | Discharge status: Home / Self Care | Payer: 59 | Source: Ambulatory Visit | Attending: Psychiatry | Admitting: Psychiatry

## 2020-05-06 DIAGNOSIS — G35 Multiple sclerosis: Secondary | ICD-10-CM

## 2020-05-06 MED ORDER — GADOBENATE DIMEGLUMINE 529 MG/ML IV SOLN
15.0000 mL | Freq: Once | INTRAVENOUS | Status: AC | PRN
  Administered 2020-05-06: 15 mL via INTRAVENOUS

## 2020-05-16 IMAGING — CR DG CHEST 2V
2 series · 2 of 2 positions shown · non-contrast
Comparison: None.

CLINICAL DATA: Persistent cough.

EXAM:
CHEST - 2 VIEW

[w chest pa]
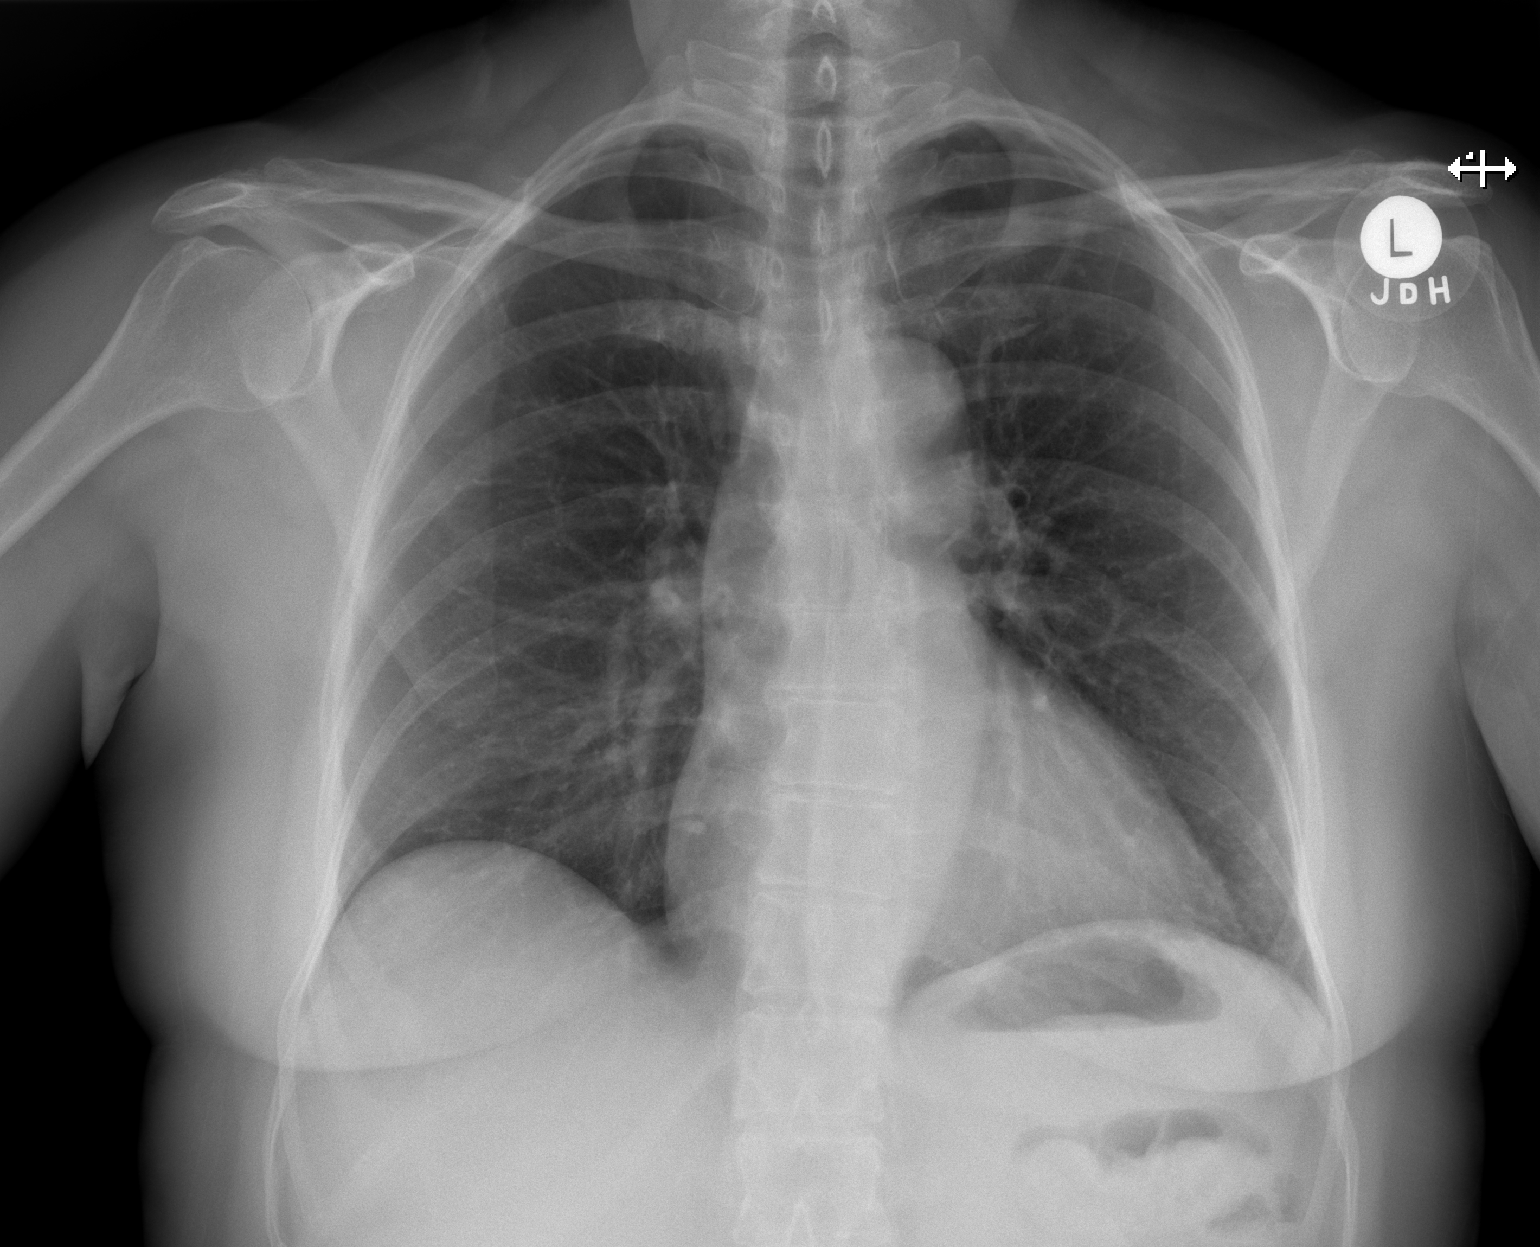

[w chest lat]
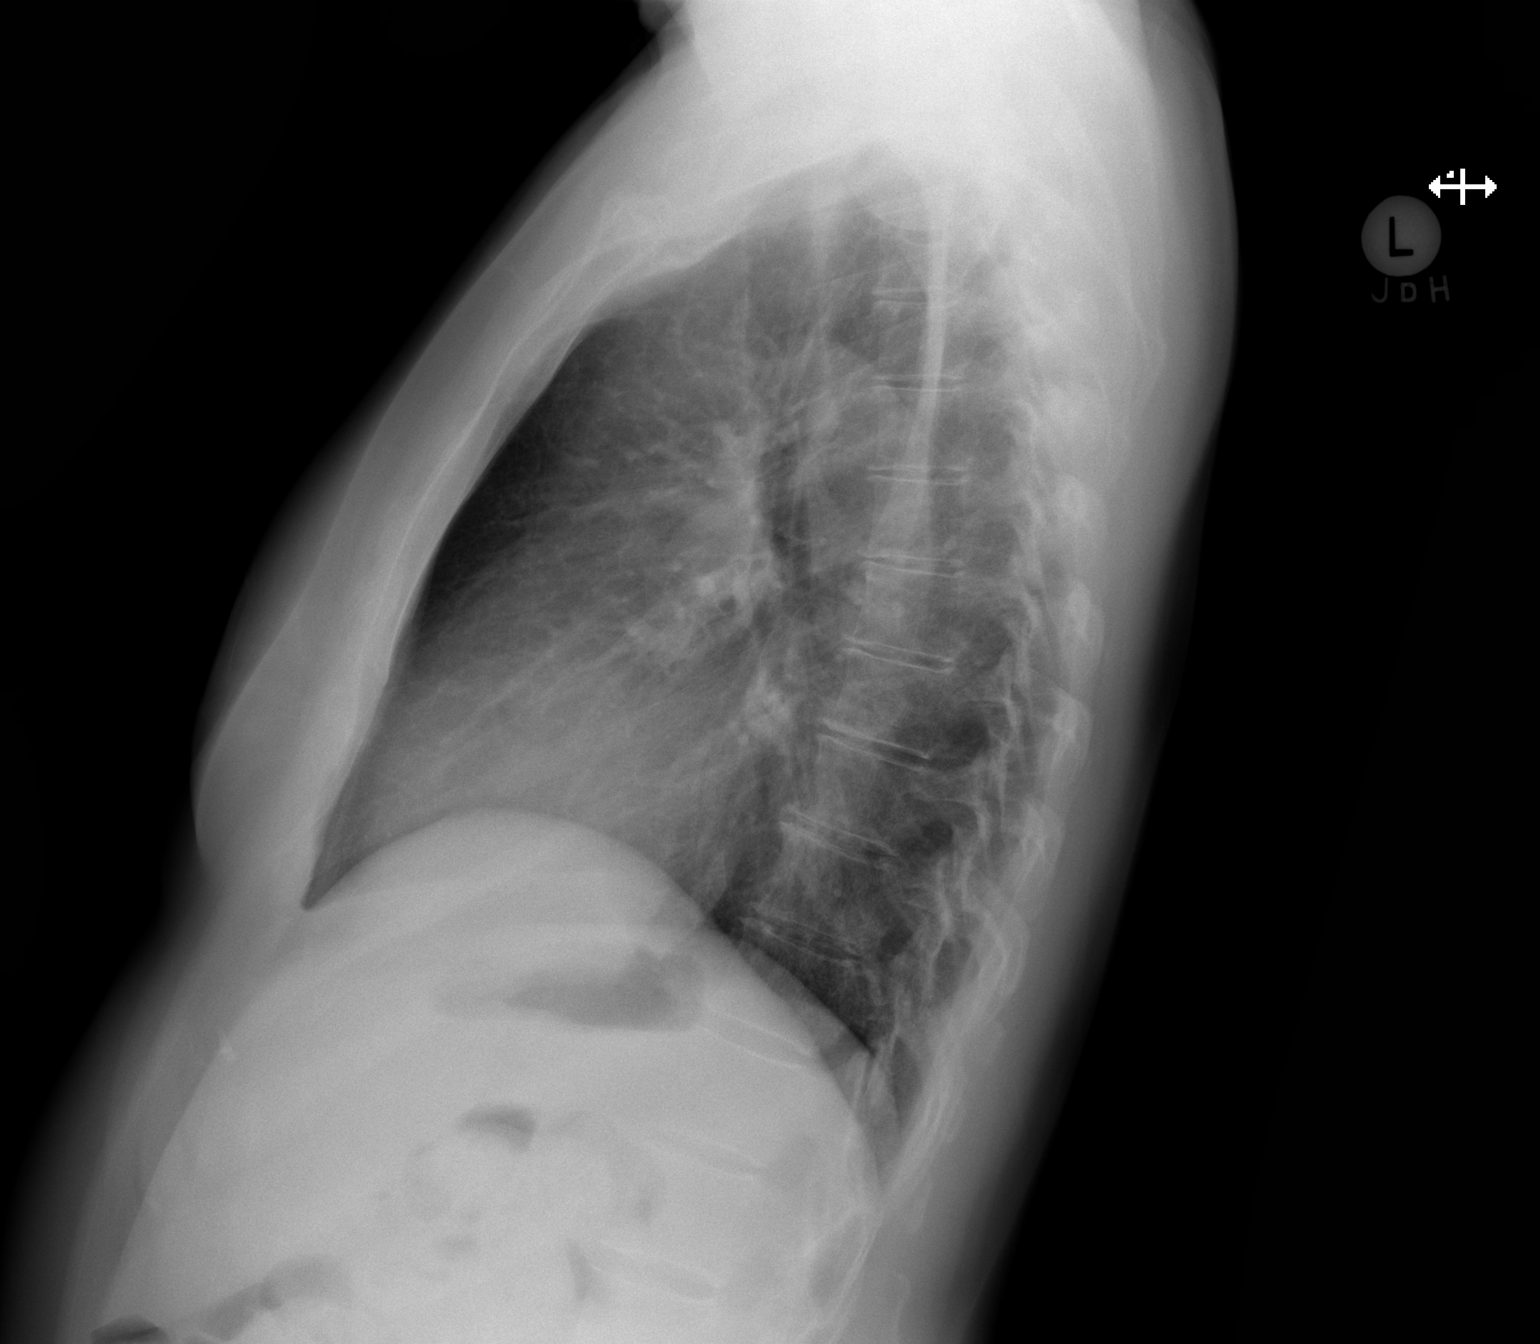

[2 of 2 positions shown; findings below may reference images not displayed]

FINDINGS: The heart size and mediastinal contours are within normal limits.
Both lungs are clear. No pneumothorax or pleural effusion is noted.
The visualized skeletal structures are unremarkable.
IMPRESSION: No active cardiopulmonary disease.

## 2020-06-04 ENCOUNTER — Other Ambulatory Visit: Payer: Self-pay | Admitting: Allergy and Immunology

## 2020-06-19 LAB — STREP PNEUMONIAE 23 SEROTYPES IGG
Pneumo Ab Type 1*: 1 ug/mL — ABNORMAL LOW (ref >1.3)
Pneumo Ab Type 12 (12F)*: 0.2 ug/mL — ABNORMAL LOW (ref >1.3)
Pneumo Ab Type 14*: 2.8 ug/mL (ref >1.3)
Pneumo Ab Type 17 (17F)*: 0.6 ug/mL — ABNORMAL LOW (ref >1.3)
Pneumo Ab Type 19 (19F)*: 3.6 ug/mL (ref >1.3)
Pneumo Ab Type 2*: 1.3 ug/mL — ABNORMAL LOW (ref >1.3)
Pneumo Ab Type 20*: 3.1 ug/mL (ref >1.3)
Pneumo Ab Type 22 (22F)*: 5.1 ug/mL (ref >1.3)
Pneumo Ab Type 23 (23F)*: 7.2 ug/mL (ref >1.3)
Pneumo Ab Type 26 (6B)*: 10.3 ug/mL (ref >1.3)
Pneumo Ab Type 3*: 4.7 ug/mL (ref >1.3)
Pneumo Ab Type 34 (10A)*: 3.7 ug/mL (ref >1.3)
Pneumo Ab Type 4*: 0.9 ug/mL — ABNORMAL LOW (ref >1.3)
Pneumo Ab Type 43 (11A)*: 1.5 ug/mL (ref >1.3)
Pneumo Ab Type 5*: 1.3 ug/mL — ABNORMAL LOW (ref >1.3)
Pneumo Ab Type 51 (7F)*: 12.7 ug/mL (ref >1.3)
Pneumo Ab Type 54 (15B)*: 6.7 ug/mL (ref >1.3)
Pneumo Ab Type 56 (18C)*: 4.4 ug/mL (ref >1.3)
Pneumo Ab Type 57 (19A)*: 3.8 ug/mL (ref >1.3)
Pneumo Ab Type 68 (9V)*: 1.6 ug/mL (ref >1.3)
Pneumo Ab Type 70 (33F)*: 0.7 ug/mL — ABNORMAL LOW (ref >1.3)
Pneumo Ab Type 8*: 0.3 ug/mL — ABNORMAL LOW (ref >1.3)
Pneumo Ab Type 9 (9N)*: 1.2 ug/mL — ABNORMAL LOW (ref >1.3)

## 2020-07-22 ENCOUNTER — Telehealth: Payer: Self-pay | Admitting: Allergy and Immunology

## 2020-07-22 NOTE — Telephone Encounter (Signed)
I called and spoke with the patient and she stated that she was waiting for the records to be sent to her neurologists to see if they agree for her to be on immunotherapy from our office? Have these been sent? Please advise.

## 2020-07-22 NOTE — Telephone Encounter (Signed)
Called and informed the patient that her records have been re-faxed to the neurologist. Patient verbalized understanding and is waiting to hear back from her neurologists office in regards to getting the ok for her to receive immunotherapy injections. Patient states that she will call us back after she hears from them.

## 2020-07-22 NOTE — Telephone Encounter (Signed)
Patient called and said that she has not heard anything about if you want her to go back on injection. [phone]

## 2020-07-22 NOTE — Telephone Encounter (Signed)
Will you please just to be safe?

## 2020-07-31 NOTE — Telephone Encounter (Signed)
Patient called and states that the labs that were sent to Dr. Jacqulynn Cadet was from last August, not the most recent labs from 06/10/2020. Patient would like the labs from 7/13 sent to Dr. Jacqulynn Cadet Attention: Butch Penny.

## 2020-07-31 NOTE — Telephone Encounter (Signed)
Mary Odom can you help me with this please? Thank You.

## 2020-07-31 NOTE — Telephone Encounter (Signed)
I understand. I called and talked to the patient and she said that she just talked to the nurse this morning and they stated that they did not receive it. She was asking if we could fax over the Pneumonia titers that we did this year in July and if we could fax that back over to Donna's attention? Thank You so much for your help, Elmyra Ricks.

## 2020-08-19 ENCOUNTER — Other Ambulatory Visit: Payer: Self-pay | Admitting: Allergy & Immunology

## 2020-09-11 ENCOUNTER — Other Ambulatory Visit: Payer: Self-pay | Admitting: Allergy and Immunology

## 2020-09-30 ENCOUNTER — Other Ambulatory Visit: Payer: Self-pay

## 2020-09-30 ENCOUNTER — Encounter: Payer: Self-pay | Admitting: Allergy and Immunology

## 2020-09-30 ENCOUNTER — Ambulatory Visit (INDEPENDENT_AMBULATORY_CARE_PROVIDER_SITE_OTHER): Payer: 59 | Admitting: Allergy and Immunology

## 2020-09-30 VITALS — BP 120/66 | HR 63 | Temp 98.3°F | Resp 16

## 2020-09-30 DIAGNOSIS — K219 Gastro-esophageal reflux disease without esophagitis: Secondary | ICD-10-CM | POA: Diagnosis not present

## 2020-09-30 DIAGNOSIS — D801 Nonfamilial hypogammaglobulinemia: Secondary | ICD-10-CM | POA: Diagnosis not present

## 2020-09-30 DIAGNOSIS — J3089 Other allergic rhinitis: Secondary | ICD-10-CM | POA: Diagnosis not present

## 2020-09-30 DIAGNOSIS — J4540 Moderate persistent asthma, uncomplicated: Secondary | ICD-10-CM | POA: Diagnosis not present

## 2020-09-30 DIAGNOSIS — D849 Immunodeficiency, unspecified: Secondary | ICD-10-CM

## 2020-09-30 NOTE — Patient Instructions (Signed)
  1.  Continue to Treat and prevent inflammation:   A.  Symbicort 160 -2 inhalations twice a day with a spacer  B.  Fluticasone + Azelastine - 1 spray each nostril two times a day.     C.  Montelukast 10 mg -1 tablet daily  2.  Continue to Treat and prevent reflux:   A.  Consolidate caffeine and chocolate consumption  B.  famotidine 40mg  - 1 tablet in evening  3. If needed:   A. Albuterol HFA - 2 inhalations every 4-6 hours  B. Nasal saline  4. Return to clinic in 12 months or earlier if problem  5.  Obtain fall flu vaccine

## 2020-09-30 NOTE — Progress Notes (Signed)
[place]   Follow-up Note  Referring Provider: Antony Contras, MD Primary Provider: Antony Contras, MD Date of Office Visit: 09/30/2020  Subjective:   Mary Odom (DOB: Sep 28, 1961) is a 59 y.o. 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  I last saw her in this clinic on 25 March 2020.  Overall she has done very well since her last visit.  She has not had any significant infections that required the administration of an antibiotic.  She has had very little issues with her airway while consistently using anti-inflammatory agents for both her upper and lower airway.  She rarely uses a short acting bronchodilator.  She is not really exercising to any degree at this point in time.  Her reflux is under very good control with famotidine.  She has had 3 Moderna Covid vaccinations.  She continues on rituximab infusions and currently is using azithromycin 3 times per week as a infectious disease preventative agent prescribed by her immunologist.  Allergies as of 09/30/2020      Reactions   Latex       Medication List      albuterol 108 (90 Base) MCG/ACT inhaler Commonly known as: VENTOLIN HFA 2 inhalations every 4-6 hours as needed   aspirin 81 MG tablet Take 81 mg by mouth daily.   azelastine 0.05 % ophthalmic solution Commonly known as: OPTIVAR   azelastine 0.1 % nasal spray Commonly known as: ASTELIN PLACE 1 SPRAY INTO BOTH NOSTRILS 2 TIMES A DAY.   cetirizine 10 MG tablet Commonly known as: ZYRTEC Take 10 mg by mouth daily.   famotidine 40 MG tablet Commonly known as: PEPCID TAKE 1 TABLET DAILY AS DIRECTED.   Fish Oil 1000 MG Caps Take 1 capsule by mouth daily.    fluticasone 50 MCG/ACT nasal spray Commonly known as: FLONASE Place 1 spray into both nostrils 2 (two) times daily.   gabapentin 300 MG capsule Commonly known as: NEURONTIN Take 300 mg by mouth at bedtime.   ibuprofen 200 MG tablet Commonly known as: ADVIL Take 400 mg by mouth daily.   Lutein-Zeaxanthin 25-5 MG Caps lutein-zeaxanthin   Metamucil 0.36 g Caps Generic drug: Psyllium Metamucil   MIRALAX PO Miralax   modafinil 200 MG tablet Commonly known as: PROVIGIL Take 200 mg by mouth daily.   montelukast 10 MG tablet Commonly known as: SINGULAIR Take 1 tablet (10 mg total) by mouth at bedtime.   omeprazole 20 MG capsule Commonly known as: PRILOSEC omeprazole 20 mg capsule,delayed release   Pazeo 0.7 % Soln Generic drug: Olopatadine HCl   pyridOXINE 50 MG tablet Commonly known as: VITAMIN B-6 Take 50 mg by mouth daily.   Rituxan 100 MG/10ML injection Generic drug: riTUXimab Rituxan 10 mg/mL concentrate,intravenous  Inject by intravenous route.   simvastatin 20 MG tablet Commonly known as: ZOCOR Take 20 mg by mouth at bedtime.   Symbicort 160-4.5 MCG/ACT inhaler Generic drug: budesonide-formoterol USE 2 PUFFS TWICE A DAY.   temazepam 15 MG capsule Commonly known as: RESTORIL Take 15 mg by mouth at bedtime as needed for sleep.   Vitamin D (Ergocalciferol) 1.25 MG (50000 UNIT) Caps capsule Commonly known as: DRISDOL Take 2,000 Units by mouth daily.  Past Medical History:  Diagnosis Date  . Asthma, well controlled 08/29/2019  . High cholesterol   . MS (multiple sclerosis) ([place])    Dx 1986    Past Surgical History:  Procedure Laterality Date  . COLONOSCOPY  2012   2 benign polyps  . MYRINGOTOMY WITH TUBE PLACEMENT Left 09/04/2019  . WISDOM TOOTH EXTRACTION      Review of systems negative except as noted in HPI / PMHx or noted below:  Review of Systems  Constitutional: Negative.   HENT: Negative.   Eyes: Negative.   Respiratory:  Negative.   Cardiovascular: Negative.   Gastrointestinal: Negative.   Genitourinary: Negative.   Musculoskeletal: Negative.   Skin: Negative.   Neurological: Negative.   Endo/Heme/Allergies: Negative.   Psychiatric/Behavioral: Negative.      Objective:   Vitals:   09/30/20 1514  BP: 120/66  Pulse: 63  Resp: 16  Temp: 98.3 F (36.8 C)  SpO2: 97%          Physical Exam Constitutional:      Appearance: She is not diaphoretic.  HENT:     Head: Normocephalic.     Right Ear: Tympanic membrane, ear canal and external ear normal. A PE tube is present.     Left Ear: Tympanic membrane, ear canal and external ear normal. A PE tube is present.     Nose: Nose normal. No mucosal edema or rhinorrhea.     Mouth/Throat:     Pharynx: Uvula midline. No oropharyngeal exudate.  Eyes:     Conjunctiva/sclera: Conjunctivae normal.  Neck:     Thyroid: No thyromegaly.     Trachea: Trachea normal. No tracheal tenderness or tracheal deviation.  Cardiovascular:     Rate and Rhythm: Normal rate and regular rhythm.     Heart sounds: Normal heart sounds, S1 normal and S2 normal. No murmur heard.   Pulmonary:     Effort: No respiratory distress.     Breath sounds: Normal breath sounds. No stridor. No wheezing or rales.  Lymphadenopathy:     Head:     Right side of head: No tonsillar adenopathy.     Left side of head: No tonsillar adenopathy.     Cervical: No cervical adenopathy.  Skin:    Findings: Rash present. No erythema.     Nails: There is no clubbing.  Neurological:     Mental Status: She is alert.     Diagnostics:   Results of blood tests obtained 10 June 2020 identified the lack of any response to a Pneumovax vaccination concerning very low titers of antibodies directed against 9 out of 23 serotypes of pneumococcus.  Assessment and Plan:   1. Asthma, moderate persistent, well-controlled   2. Perennial allergic rhinitis   3. LPRD (laryngopharyngeal reflux disease)   4.  Hypogammaglobulinemia ([place])   5. Immunosuppression ([place])     1.  Continue to Treat and prevent inflammation:   A.  Symbicort 160 -2 inhalations twice a day with a spacer  B.  Fluticasone + Azelastine - 1 spray each nostril two times a day.     C.  Montelukast 10 mg -1 tablet daily  2.  Continue to Treat and prevent reflux:   A.  Consolidate caffeine and chocolate consumption  B.  famotidine 40mg  - 1 tablet in evening  3. If needed:   A. Albuterol HFA - 2 inhalations every 4-6 hours  B. Nasal saline  4. Return to clinic in 12 months or earlier if problem  5.  Obtain fall flu vaccine  Talana appears to be doing relatively well regarding her airway issue on her current medical plan which includes anti-inflammatory agents for her airway and therapy directed against reflux.  As well, she has not really had significant infections on her current immunosuppression.  Her immunoglobulin G level hangs around 500 or so and she does appear to be able to maintain a good antibody response to previous immunogen exposures but certainly cannot respond very well to novel polysaccharide immune agents at this point in time.  Currently there is no move to have her start immunoglobulin infusions but I suspect that if she continues to use rituximab long-term there is going to be a time when her immunoglobulin G level will drop and she may start to lose immunological B-cell memory.  I am going to see her back in this clinic 1 time per year so that we will be available to address her issues should she lose control of either her asthma or develop recurrent infections.  She will follow up with her neurologist who is giving her rituximab and the immunologist who is following her as well concerning this form of therapy.  Allena Katz, MD Allergy / Immunology [place]

## 2020-10-01 ENCOUNTER — Encounter: Payer: Self-pay | Admitting: Allergy and Immunology

## 2020-10-13 ENCOUNTER — Other Ambulatory Visit: Payer: Self-pay | Admitting: Allergy and Immunology

## 2020-10-20 ENCOUNTER — Other Ambulatory Visit: Payer: Self-pay | Admitting: Allergy and Immunology

## 2020-10-30 IMAGING — CT CT MAXILLOFACIAL W/O CM
3 series · 16 of 47 positions shown, 19 images · non-contrast
Comparison: None.

CLINICAL DATA: Sinusitis greater than 4 weeks duration.

EXAM:
CT MAXILLOFACIAL WITHOUT CONTRAST
TECHNIQUE: Multidetector CT images of the paranasal sinuses were obtained using
the standard protocol without intravenous contrast.

[Series 3: facialbone 2.0 (person_name) (person_name) · axial · 0.32mm/px · z∈[-136,-12]mm · 10 of 72 slices shown, 13 images]
[im 5/72  brain]
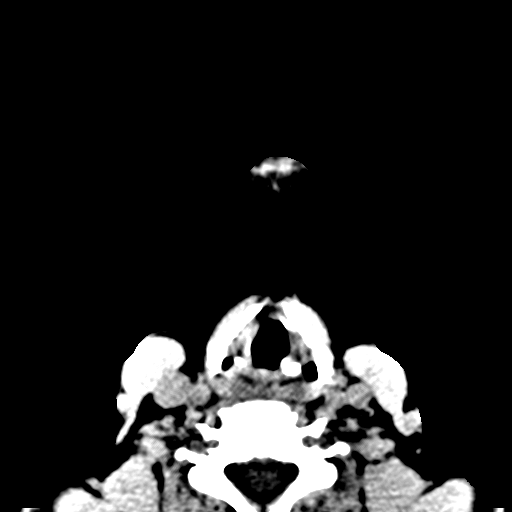
[im 5/72  bone]
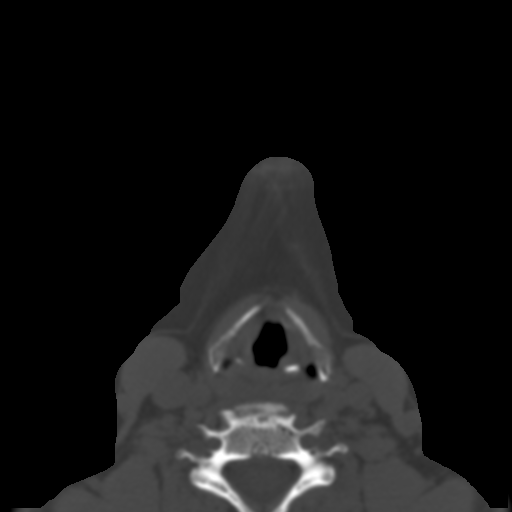
[im 13/72  bone]
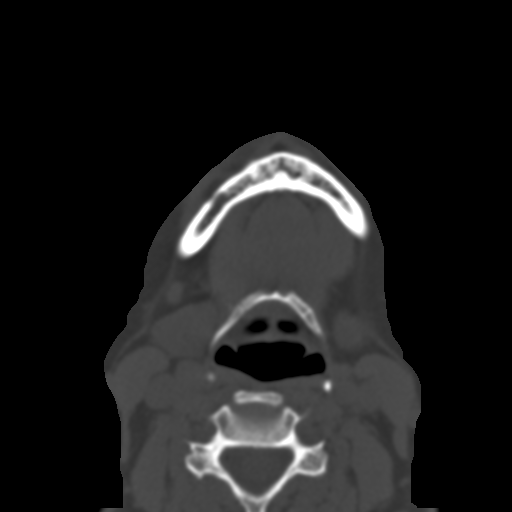
[im 20/72  bone]
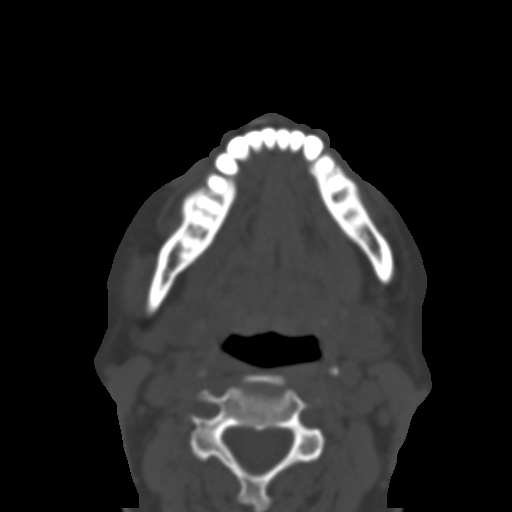
[im 25/72  bone]
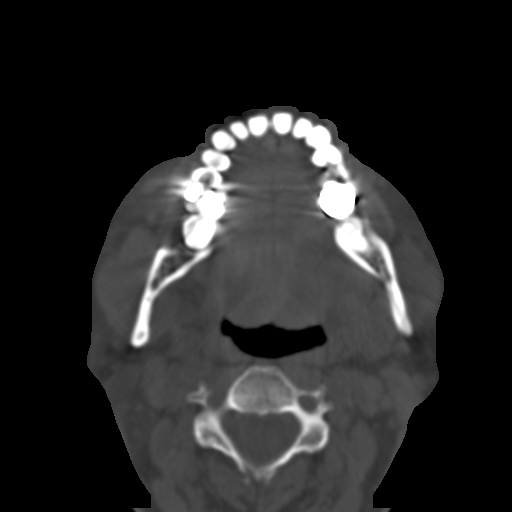
[im 32/72  brain]
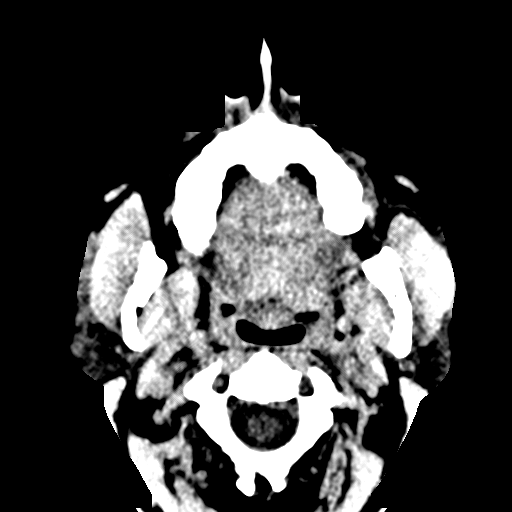
[im 32/72  bone]
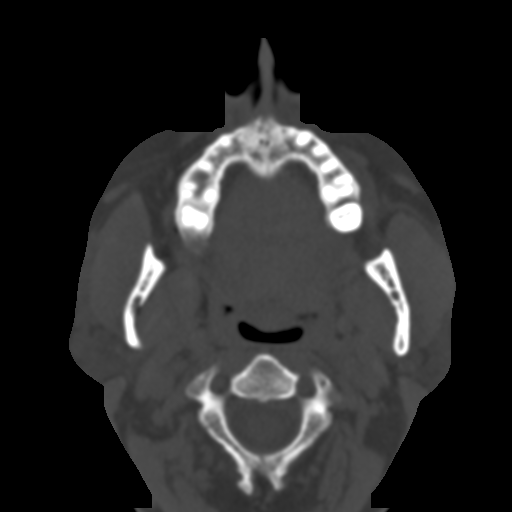
[im 40/72  bone]
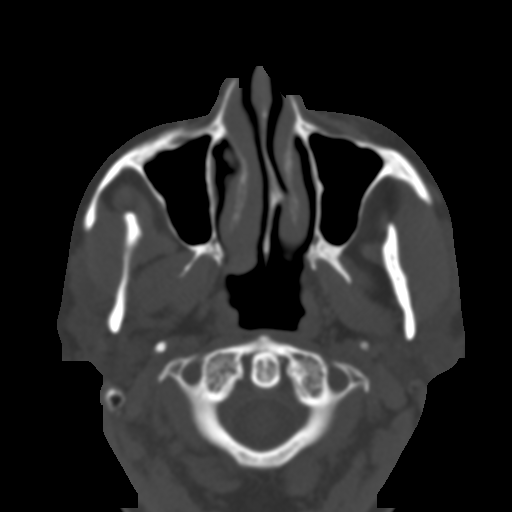
[im 47/72  bone]
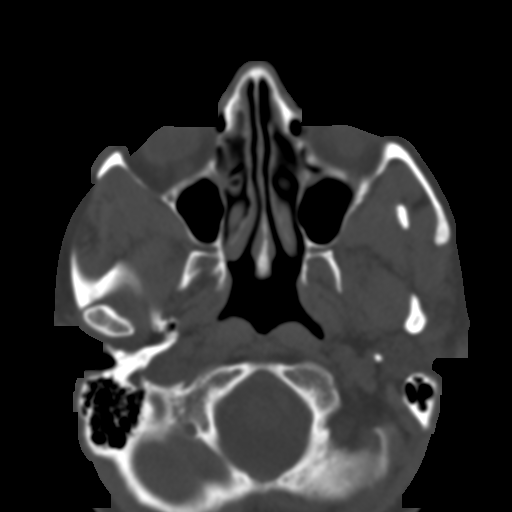
[im 54/72  bone]
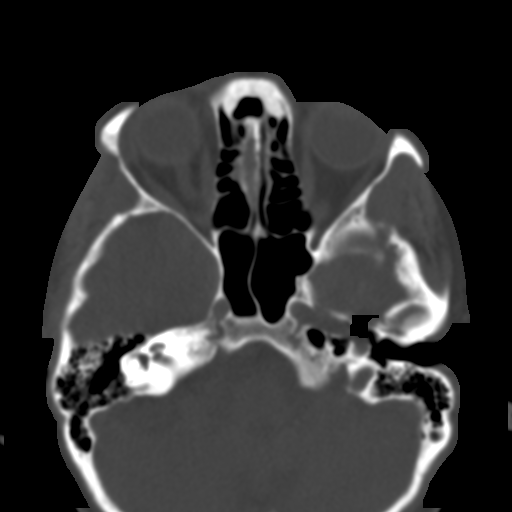
[im 59/72  brain]
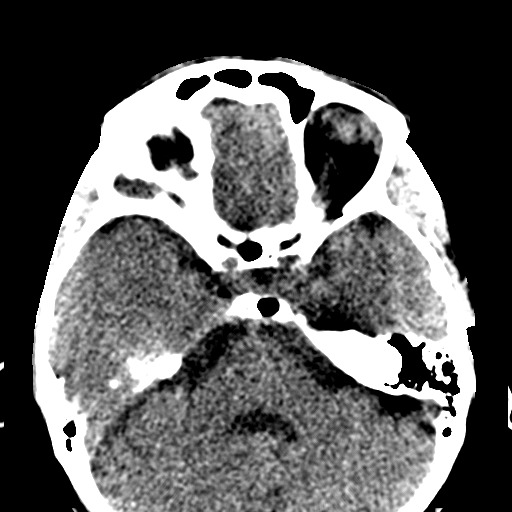
[im 59/72  bone]
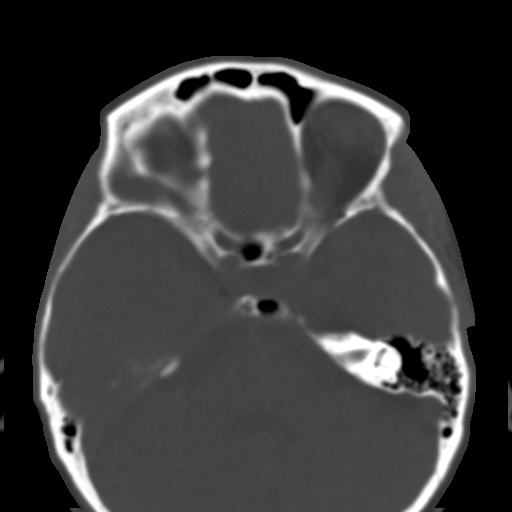
[im 67/72  bone]
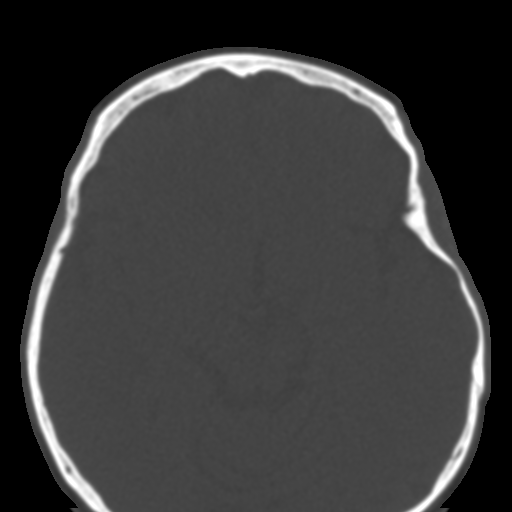

[Series 7: facialbone 2.0 cor st · coronal · 0.31mm/px · 3 of 74 slices shown]
[im 25/74  bone]
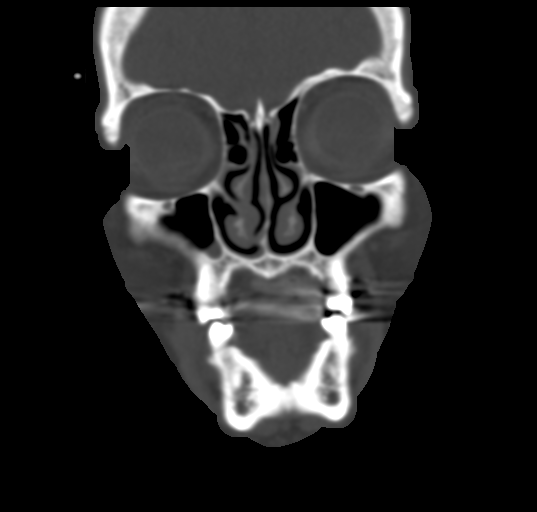
[im 33/74  bone]
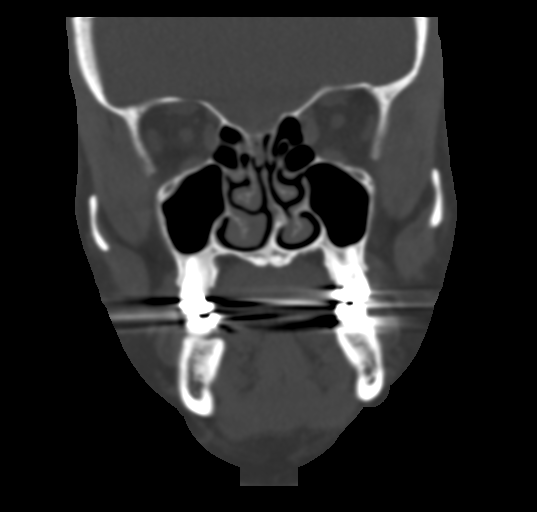
[im 41/74  bone]
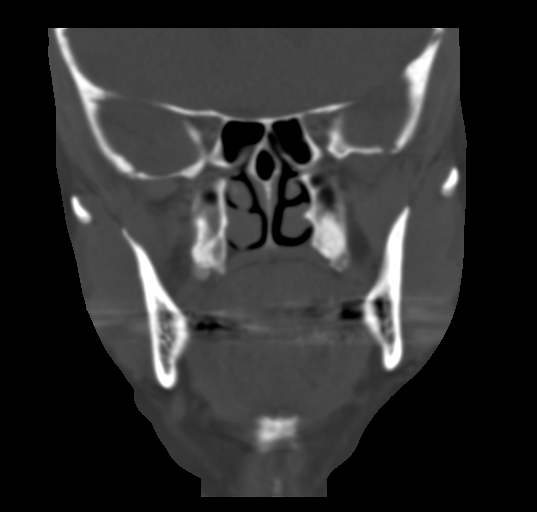

[Series 8: facialbone 2.0 sag st · sagittal · 0.31mm/px · 3 of 76 slices shown]
[im 26/76  bone]
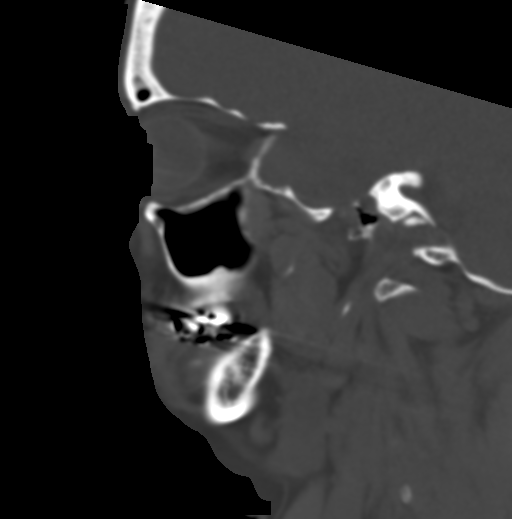
[im 38/76  bone]
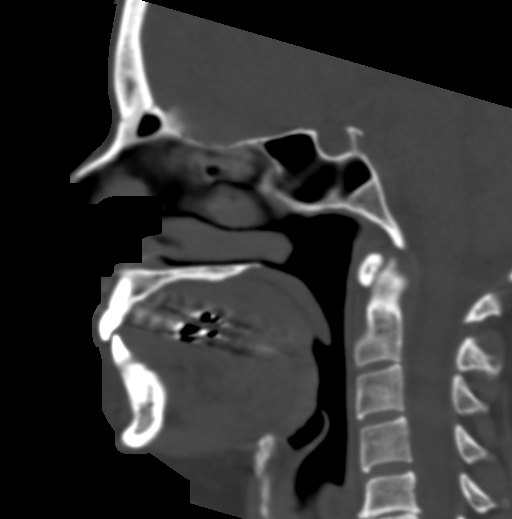
[im 51/76  bone]
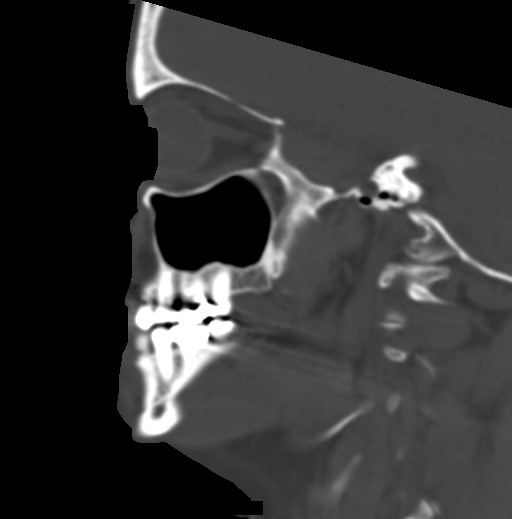

[16 of 47 positions shown; findings below may reference images not displayed]

FINDINGS: Paranasal sinuses:

Frontal: Normally aerated. Patent frontal sinus drainage pathways.

Ethmoid: Few scattered small ethmoid opacified air cells, not likely
significant.

Maxillary: Left maxillary sinus is clear except for mild focal
mucosal thickening in the anteromedial region. Right maxillary sinus
shows mild circumferential mucosal thickening. No layering fluid on
either side

Sphenoid: Normally aerated. Patent sphenoethmoidal recesses.

Right ostiomeatal unit: Infundibulum is sufficiently patent. Mild
mucosal thickening. No unfavorable variant.

Left ostiomeatal unit: Infundibulum is sufficiently patent. No
unfavorable variant. Insignificant minor concha bullosa of the
middle turbinate on the left without encroachment.

Nasal passages: Patent. Nasal septum bows 4 mm towards the left with
a left spur.

Anatomy: No pneumatization superior to anterior ethmoid notches.
Symmetric and intact olfactory grooves and fovea ethmoidalis, Keros
I (1-3mm). Sellar sphenoid pneumatization pattern. No dehiscence of
carotid or optic canals. No onodi cell.

Other: None. Surrounding soft tissues of the face appear normal. No
active dental disease. Other bony structures appear normal.
Visualized intracranial contents appear normal.
IMPRESSION: Mild mucosal thickening of the maxillary sinuses, right more than
left. No layering fluid. No unfavorable anatomic variants.

## 2020-12-10 ENCOUNTER — Other Ambulatory Visit: Payer: Self-pay | Admitting: Allergy

## 2021-04-15 ENCOUNTER — Other Ambulatory Visit: Payer: Self-pay | Admitting: Allergy and Immunology

## 2021-06-24 ENCOUNTER — Ambulatory Visit (INDEPENDENT_AMBULATORY_CARE_PROVIDER_SITE_OTHER): Payer: 59 | Admitting: Allergy and Immunology

## 2021-06-24 ENCOUNTER — Other Ambulatory Visit: Payer: Self-pay

## 2021-06-24 VITALS — BP 118/84 | HR 68 | Temp 98.6°F | Resp 20 | Ht 63.6 in | Wt 164.4 lb

## 2021-06-24 DIAGNOSIS — D849 Immunodeficiency, unspecified: Secondary | ICD-10-CM

## 2021-06-24 DIAGNOSIS — K219 Gastro-esophageal reflux disease without esophagitis: Secondary | ICD-10-CM

## 2021-06-24 DIAGNOSIS — J3089 Other allergic rhinitis: Secondary | ICD-10-CM

## 2021-06-24 DIAGNOSIS — J4540 Moderate persistent asthma, uncomplicated: Secondary | ICD-10-CM | POA: Diagnosis not present

## 2021-06-24 MED ORDER — FAMOTIDINE 40 MG PO TABS: ORAL_TABLET | ORAL | 1 refills | Status: DC

## 2021-06-24 MED ORDER — ALBUTEROL SULFATE HFA 108 (90 BASE) MCG/ACT IN AERS: INHALATION_SPRAY | RESPIRATORY_TRACT | 1 refills | Status: DC

## 2021-06-24 MED ORDER — CLINDAMYCIN HCL 150 MG PO CAPS: ORAL_CAPSULE | ORAL | 0 refills | Status: DC

## 2021-06-24 NOTE — Progress Notes (Signed)
[place] [facility] [place] [facility] [place]   Follow-up Note  Referring Provider: Antony Contras, MD Primary Provider: Antony Contras, MD Date of Office Visit: 06/24/2021  Subjective:   Berniece Pap (DOB: 05-02-1961) is a 60 y.o. 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  Her last visit to this clinic was 30 September 2020.  She was apparently doing well until around mid May.  In mid May while undergoing her rituximab infusion, using a generic rituximab formulation, she developed a "sinus infection" which was head congestion and some coughing for which she was given cephalexin.  Even though her head cleared she has lots of coughing and she was once again given a prescription of cephalexin in June 2022.  Basically she has been coughing since May 2022.  In association with her cough she is making green sputum production and has wheezing.  She does not have that much upper airway symptoms at this point in time.  She does not have any throat problems.  She does not think that her reflux has exacerbated in the context of this infection.  She continues on azithromycin 3 times per week as directed by her immunologist for rituximab induced immunosuppression.   Allergies as of 06/24/2021       Reactions   Latex Rash        Medication List    albuterol 108 (90 Base) MCG/ACT inhaler Commonly known as: VENTOLIN HFA Can inhale two puffs every four to six hours as needed for cough or wheeze.   aspirin 81 MG tablet Take 81 mg by mouth daily.   azelastine 0.05 % ophthalmic solution Commonly known as: OPTIVAR   azelastine 0.1 % nasal spray Commonly known as: ASTELIN PLACE 1 SPRAY INTO BOTH NOSTRILS 2 TIMES A DAY.    azithromycin 500 MG tablet Commonly known as: ZITHROMAX SMARTSIG:1 Tablet(s) By Mouth 6 Times a Week   benzonatate 200 MG capsule Commonly known as: TESSALON Take 200 mg by mouth 3 (three) times daily as needed.   cetirizine 10 MG tablet Commonly known as: ZYRTEC Take 10 mg by mouth daily.   clindamycin 150 MG capsule Commonly known as: CLEOCIN Take one tablet three times daily for fourteen days. Started by: Jiles Prows, MD   famotidine 40 MG tablet Commonly known as: PEPCID Take one tablet by mouth twice daily as directed.   Fish Oil 1000 MG Caps Take 1 capsule by mouth daily.   fluticasone 50 MCG/ACT nasal spray Commonly known as: FLONASE Place 1 spray into both nostrils 2 (two) times daily.   gabapentin 300 MG capsule Commonly known as: NEURONTIN Take 300 mg by mouth at bedtime.   ibuprofen 200 MG tablet Commonly known as: ADVIL Take 400 mg by mouth daily.   Lutein-Zeaxanthin 25-5 MG Caps lutein-zeaxanthin   MIRALAX PO Miralax   modafinil 200 MG tablet Commonly known as: PROVIGIL Take 200 mg by mouth daily.   montelukast 10 MG tablet Commonly known as: SINGULAIR TAKE 1 TABLET ONCE DAILY.   ondansetron 4 MG tablet Commonly known as: ZOFRAN Take 4 mg by mouth every 8 (eight) hours as needed.   Psyllium 0.36 g Caps Metamucil   pyridOXINE 50 MG tablet Commonly known as: VITAMIN B-6 Take 50 mg by  mouth daily.   Restora RX 60-1.25 MG Caps Generic drug: Lactobacillus Casei-Folic Acid Take 1 capsule by mouth daily.   riTUXimab 100 MG/10ML injection Commonly known as: RITUXAN Rituxan 10 mg/mL concentrate,intravenous  Inject by intravenous route.   RUXIENCE IV Inject into the vein. Last infusion on May 5th, 2022   simvastatin 20 MG tablet Commonly known as: ZOCOR Take 20 mg by mouth at bedtime.   Symbicort 160-4.5 MCG/ACT inhaler Generic drug: budesonide-formoterol USE 2 PUFFS TWICE A DAY.   temazepam 15 MG capsule Commonly known as:  RESTORIL Take 15 mg by mouth at bedtime as needed for sleep.   Vitamin D (Ergocalciferol) 1.25 MG (50000 UNIT) Caps capsule Commonly known as: DRISDOL Take 2,000 Units by mouth daily.        Past Medical History:  Diagnosis Date   Asthma, well controlled 08/29/2019   High cholesterol    MS (multiple sclerosis) ([place])    Dx 1986    Past Surgical History:  Procedure Laterality Date   COLONOSCOPY  2012   2 benign polyps   MYRINGOTOMY WITH TUBE PLACEMENT Left 09/04/2019   WISDOM TOOTH EXTRACTION      Review of systems negative except as noted in HPI / PMHx or noted below:  Review of Systems  Constitutional: Negative.   HENT: Negative.    Eyes: Negative.   Respiratory: Negative.    Cardiovascular: Negative.   Gastrointestinal: Negative.   Genitourinary: Negative.   Musculoskeletal: Negative.   Skin: Negative.   Neurological: Negative.   Endo/Heme/Allergies: Negative.   Psychiatric/Behavioral: Negative.      Objective:   Vitals:   06/24/21 1605  BP: 118/84  Pulse: 68  Resp: 20  Temp: 98.6 F (37 C)  SpO2: 96%   Height: 5' 3.6" (161.5 cm)  Weight: 164 lb 6.4 oz (74.6 kg)   Physical Exam Constitutional:      Appearance: She is not diaphoretic.  HENT:     Head: Normocephalic.     Right Ear: Tympanic membrane, ear canal and external ear normal.     Left Ear: Tympanic membrane, ear canal and external ear normal.     Nose: Nose normal. No mucosal edema or rhinorrhea.     Mouth/Throat:     Pharynx: Uvula midline. No oropharyngeal exudate.  Eyes:     Conjunctiva/sclera: Conjunctivae normal.  Neck:     Thyroid: No thyromegaly.     Trachea: Trachea normal. No tracheal tenderness or tracheal deviation.  Cardiovascular:     Rate and Rhythm: Normal rate and regular rhythm.     Heart sounds: Normal heart sounds, S1 normal and S2 normal. No murmur heard. Pulmonary:     Effort: No respiratory distress.     Breath sounds: Normal breath sounds. No stridor. No  wheezing or rales.  Lymphadenopathy:     Head:     Right side of head: No tonsillar adenopathy.     Left side of head: No tonsillar adenopathy.     Cervical: No cervical adenopathy.  Skin:    Findings: No erythema or rash.     Nails: There is no clubbing.  Neurological:     Mental Status: She is alert.    Diagnostics:    Spirometry was performed and demonstrated an FEV1 of 1.47 at 59 % of predicted.  Assessment and Plan:   1. Not well controlled moderate persistent asthma   2. Perennial allergic rhinitis   3. LPRD (laryngopharyngeal reflux disease)   4. Immunosuppression ([place])  1.  Continue to Treat and prevent inflammation:   A.  Symbicort 160 -2 inhalations twice a day with a spacer  B.  Fluticasone + Azelastine - 1 spray each nostril two times a day.     C.  Montelukast 10 mg -1 tablet daily  D.  Prednisone 10 mg - 1 tablet 1 time per day for 10 days only  2.  Continue to Treat and prevent reflux:   A.  Consolidate caffeine and chocolate consumption  B.  INCREASE famotidine '40mg'$  - 1 tablet 2 times per day  3. Treat infection:   A. Clindamycin 150 - 1 tablet 3 times per day for 14 days only  4. If needed:   A. Albuterol HFA - 2 inhalations every 4-6 hours  B. Nasal saline  C. Mucinex DM - 2 times per day  5. Further treatment???  6. Return to clinic in 4 weeks or earlier if problem  5. Plan for fall flu vaccine  I am going to have Keyatta use some anti-inflammatory medications for her airway including the use of low-dose systemic steroid while she continues on Symbicort and nasal fluticasone and montelukast and give her a broad-spectrum antibiotic using clindamycin for the next 14 days.  To cover the possibility that some of her cough is also driven by her preexistent LPR we will have her use famotidine twice a day.  It is interesting to note that her unrelenting cough appears to occur in the context of using her generic rituximab infusion.  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  Her situation may be changing and we may need to have her evaluated for a more significant infection other than bacterium and also readdress the issue of rituximab induced immunosuppression based upon her response to the therapy noted above.  Allena Katz, MD Allergy / Immunology [place]

## 2021-06-24 NOTE — Patient Instructions (Addendum)
  1.  Continue to Treat and prevent inflammation:   A.  Symbicort 160 -2 inhalations twice a day with a spacer  B.  Fluticasone + Azelastine - 1 spray each nostril two times a day.     C.  Montelukast 10 mg -1 tablet daily  D.  Prednisone 10 mg - 1 tablet 1 time per day for 10 days only  2.  Continue to Treat and prevent reflux:   A.  Consolidate caffeine and chocolate consumption  B.  INCREASE famotidine '40mg'$  - 1 tablet 2 times per day  3. Treat infection:   A. Clindamycin 150 - 1 tablet 3 times per day for 14 days only  4. If needed:   A. Albuterol HFA - 2 inhalations every 4-6 hours  B. Nasal saline  C. Mucinex DM - 2 times per day  5. Further treatment???  6. Return to clinic in 4 weeks or earlier if problem  5. Plan for fall flu vaccine

## 2021-06-25 ENCOUNTER — Encounter: Payer: Self-pay | Admitting: Allergy and Immunology

## 2021-07-01 ENCOUNTER — Telehealth: Payer: Self-pay | Admitting: Allergy and Immunology

## 2021-07-01 NOTE — Telephone Encounter (Signed)
Patient states she still has a cough and fever with some runny nose. She is still taking the prednisone and she took 2 Covid test and they were negative. She would like to know what else she can do for this issue. Please advice. Thank you

## 2021-07-01 NOTE — Telephone Encounter (Signed)
Patient called and said that she came in last week to see you and you put her a lot of stuff and she is still coughing and running a fever of 100.3. she took a covid test and it was neg. She would like to know what to do. [place]  [phone].

## 2021-07-01 NOTE — Telephone Encounter (Signed)
Lets have her get a chest x-ray.  Lets have her get sputum for bacterial culture.  Lets have her get a swab for viral respiratory pathogens.

## 2021-07-02 NOTE — Telephone Encounter (Signed)
Please advise 

## 2021-07-02 NOTE — Telephone Encounter (Signed)
Patient states she is still feeling really bad and is really tired of feeling this way. She states she still has a fever of 100.8, coughing and feels like her chest is full. She doesn't believe the medication she is taking is helping.

## 2021-07-03 ENCOUNTER — Other Ambulatory Visit: Payer: Self-pay | Admitting: Allergy and Immunology

## 2021-07-06 ENCOUNTER — Encounter: Payer: Self-pay | Admitting: Allergy and Immunology

## 2021-07-06 ENCOUNTER — Telehealth: Payer: Self-pay

## 2021-07-06 ENCOUNTER — Ambulatory Visit (INDEPENDENT_AMBULATORY_CARE_PROVIDER_SITE_OTHER): Payer: 59 | Admitting: Allergy and Immunology

## 2021-07-06 ENCOUNTER — Other Ambulatory Visit: Payer: Self-pay

## 2021-07-06 VITALS — BP 124/82 | HR 69 | Resp 16

## 2021-07-06 DIAGNOSIS — D849 Immunodeficiency, unspecified: Secondary | ICD-10-CM

## 2021-07-06 DIAGNOSIS — J4540 Moderate persistent asthma, uncomplicated: Secondary | ICD-10-CM

## 2021-07-06 DIAGNOSIS — J988 Other specified respiratory disorders: Secondary | ICD-10-CM

## 2021-07-06 DIAGNOSIS — K219 Gastro-esophageal reflux disease without esophagitis: Secondary | ICD-10-CM

## 2021-07-06 DIAGNOSIS — D801 Nonfamilial hypogammaglobulinemia: Secondary | ICD-10-CM

## 2021-07-06 DIAGNOSIS — J3089 Other allergic rhinitis: Secondary | ICD-10-CM | POA: Diagnosis not present

## 2021-07-06 MED ORDER — MOXIFLOXACIN HCL 400 MG PO TABS: ORAL_TABLET | ORAL | 0 refills | Status: DC

## 2021-07-06 MED ORDER — HYDROCOD POLST-CPM POLST ER 10-8 MG/5ML PO SUER: ORAL | 0 refills | Status: DC

## 2021-07-06 NOTE — Telephone Encounter (Signed)
Pt is schduled for this evening at [address] in [place] with kozlow. She wants to talk to him about these test before doing them. Her symptoms are no better.

## 2021-07-06 NOTE — Progress Notes (Signed)
[facility] [facility] [place] [facility] [place]   Follow-up Note  Referring Provider: Antony Contras, MD Primary Provider: Antony Contras, MD Date of Office Visit: 07/06/2021  Subjective:   Mary Odom (DOB: 1961-02-03) is a 60 y.o. female who returns to the [facility] on 07/06/2021 in re-evaluation of the following:  HPI: [name] returns to this clinic in reevaluation of asthma and allergic rhinoconjunctivitis and LPR and history of recurrent infections in the setting of immunosuppression and hypogammaglobulinemia with rituximab.  Her last visit to this clinic was 24 June 2021.  When I last saw in this clinic she was having difficulty for several weeks with "sinus infection" ,associated with multiple negative COVID swabs, and then unrelenting cough and recurrent fevers ever since she received her latest rituximab infusion in May 2022.  She has not improved regarding her chest and she still makes green sputum production and she still has coughing although her head may have improved slightly.  This appears to be a very common phenomena for Adna whenever she receives her rituximab and she has seen an immunologist out in the [place] part of [place] who gave her azithromycin 3 times per week as a preventative for her hypogammaglobulinemia.  And during her last evaluation with me I added in clindamycin as a broad-spectrum coverage for bacterial infection.  Her reflux appears to be under pretty good control at this point in time on her current therapy.  Allergies as of 07/06/2021       Reactions   Latex Rash        Medication List    albuterol 108 (90 Base) MCG/ACT inhaler Commonly known as: VENTOLIN HFA Can inhale two puffs every four to six hours as needed for cough or wheeze.   aspirin 81 MG tablet Take 81 mg by mouth daily.   azelastine 0.05 % ophthalmic solution Commonly known as: OPTIVAR   azelastine 0.1 % nasal spray Commonly known as:  ASTELIN PLACE 1 SPRAY INTO BOTH NOSTRILS 2 TIMES A DAY.   azithromycin 500 MG tablet Commonly known as: ZITHROMAX SMARTSIG:1 Tablet(s) By Mouth 6 Times a Week   benzonatate 200 MG capsule Commonly known as: TESSALON Take 200 mg by mouth 3 (three) times daily as needed.   cetirizine 10 MG tablet Commonly known as: ZYRTEC Take 10 mg by mouth daily.   clindamycin 150 MG capsule Commonly known as: CLEOCIN Take one tablet three times daily for fourteen days.   famotidine 40 MG tablet Commonly known as: PEPCID Take one tablet by mouth twice daily as directed.   Fish Oil 1000 MG Caps Take 1 capsule by mouth daily.   fluticasone 50 MCG/ACT nasal spray Commonly known as: FLONASE Place 1 spray into both nostrils 2 (two) times daily.   gabapentin 300 MG capsule Commonly known as: NEURONTIN Take 300 mg by mouth at bedtime.   ibuprofen 200 MG tablet Commonly known as: ADVIL Take 400 mg by mouth daily.   Lutein-Zeaxanthin 25-5 MG Caps lutein-zeaxanthin   MIRALAX PO Miralax   modafinil 200 MG tablet Commonly known as: PROVIGIL Take 200 mg by mouth daily.   montelukast 10 MG tablet Commonly known as: SINGULAIR TAKE 1 TABLET ONCE DAILY.   ondansetron 4 MG tablet Commonly known as: ZOFRAN Take 4 mg by mouth every 8 (eight) hours as needed.   Psyllium 0.36 g Caps Metamucil   pyridOXINE 50 MG tablet Commonly known as: VITAMIN B-6 Take 50 mg by mouth daily.  Restora RX 60-1.25 MG Caps Generic drug: Lactobacillus Casei-Folic Acid Take 1 capsule by mouth daily.   riTUXimab 100 MG/10ML injection Commonly known as: RITUXAN   RUXIENCE IV Inject into the vein. Last infusion on May 5th, 2022   simvastatin 20 MG tablet Commonly known as: ZOCOR Take 20 mg by mouth at bedtime.   Symbicort 160-4.5 MCG/ACT inhaler Generic drug: budesonide-formoterol USE 2 PUFFS TWICE A DAY.   temazepam 15 MG capsule Commonly known as: RESTORIL Take 15 mg by mouth at bedtime as  needed for sleep.   Vitamin D (Ergocalciferol) 1.25 MG (50000 UNIT) Caps capsule Commonly known as: DRISDOL Take 2,000 Units by mouth daily.        Past Medical History:  Diagnosis Date   Asthma, well controlled 08/29/2019   High cholesterol    MS (multiple sclerosis) ([place])    Dx 1986    Past Surgical History:  Procedure Laterality Date   COLONOSCOPY  2012   2 benign polyps   MYRINGOTOMY WITH TUBE PLACEMENT Left 09/04/2019   WISDOM TOOTH EXTRACTION      Review of systems negative except as noted in HPI / PMHx or noted below:  Review of Systems  Constitutional: Negative.   HENT: Negative.    Eyes: Negative.   Respiratory: Negative.    Cardiovascular: Negative.   Gastrointestinal: Negative.   Genitourinary: Negative.   Musculoskeletal: Negative.   Skin: Negative.   Neurological: Negative.   Endo/Heme/Allergies: Negative.   Psychiatric/Behavioral: Negative.      Objective:   Vitals:   07/06/21 1751  BP: 124/82  Pulse: 69  Resp: 16  SpO2: 96%          Physical Exam Constitutional:      Appearance: She is not diaphoretic.  HENT:     Head: Normocephalic.     Right Ear: Tympanic membrane, ear canal and external ear normal.     Left Ear: Tympanic membrane, ear canal and external ear normal.     Nose: Nose normal. No mucosal edema or rhinorrhea.     Mouth/Throat:     Pharynx: Uvula midline. No oropharyngeal exudate.  Eyes:     Conjunctiva/sclera: Conjunctivae normal.  Neck:     Thyroid: No thyromegaly.     Trachea: Trachea normal. No tracheal tenderness or tracheal deviation.  Cardiovascular:     Rate and Rhythm: Normal rate and regular rhythm.     Heart sounds: Normal heart sounds, S1 normal and S2 normal. No murmur heard. Pulmonary:     Effort: No respiratory distress.     Breath sounds: Normal breath sounds. No stridor. No wheezing or rales.  Lymphadenopathy:     Head:     Right side of head: No tonsillar adenopathy.     Left side of head: No  tonsillar adenopathy.     Cervical: No cervical adenopathy.  Skin:    Findings: No erythema or rash.     Nails: There is no clubbing.  Neurological:     Mental Status: She is alert.    Diagnostics: none  Assessment and Plan:   1. Not well controlled moderate persistent asthma   2. Perennial allergic rhinitis   3. LPRD (laryngopharyngeal reflux disease)   4. Immunosuppression ([place])   5. Hypogammaglobulinemia ([place])   6. Respiratory tract infection    1.  Continue to Treat and prevent inflammation:   A.  Symbicort 160 -2 inhalations twice a day with a spacer  B.  Fluticasone + Azelastine - 1 spray  each nostril two times a day.     C.  Montelukast 10 mg -1 tablet daily  2.  Continue to Treat and prevent reflux:   A.  Consolidate caffeine and chocolate consumption  B.  Famotidine '40mg'$  - 1 tablet 2 times per day  3.  Evaluate and treat infection:   A.  Avelox 400 mg -1 tablet 1 time per day for 10 days  B.  Discontinue azithromycin and clindamycin  C.  Obtain a chest x-ray  4.  Treat immunosuppression:   A.  Arrange for immunoglobulin infusion starting this week  4. If needed:   A. Albuterol HFA - 2 inhalations every 4-6 hours  B. Nasal saline  C. Tussionex - 2.5-5.0 mls every 12 hours for cough. NARCOTIC  5. 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  I suspect she has some type of chronic infection at this point in time in her airway, hopefully not an opportunistic infection, that will respond to immunoglobulin infusions and a broad-spectrum antibiotic noted above.  We will obtain a chest x-ray and if there is any abnormality on the chest x-ray we can to chase that down and get a high-resolution chest CT scan and possible bronchoscopy.  We will try to  get her immunoglobulin infusion started this week.  Allena Katz, MD Allergy / Immunology [place]

## 2021-07-06 NOTE — Patient Instructions (Addendum)
  1.  Continue to Treat and prevent inflammation:   A.  Symbicort 160 -2 inhalations twice a day with a spacer  B.  Fluticasone + Azelastine - 1 spray each nostril two times a day.     C.  Montelukast 10 mg -1 tablet daily  2.  Continue to Treat and prevent reflux:   A.  Consolidate caffeine and chocolate consumption  B.  Famotidine '40mg'$  - 1 tablet 2 times per day  3.  Evaluate and treat infection:   A.  Avelox 400 mg -1 tablet 1 time per day for 10 days  B.  Discontinue azithromycin and clindamycin  C.  Obtain a chest x-ray  4.  Treat immunosuppression:   A.  Arrange for immunoglobulin infusion starting this week  4. If needed:   A. Albuterol HFA - 2 inhalations every 4-6 hours  B. Nasal saline  C. Tussionex - 2.5-5.0 mls every 12 hours for cough. NARCOTIC  5. Return to clinic in 4 weeks or earlier if problem

## 2021-07-06 NOTE — Telephone Encounter (Signed)
Did she perform the following?  Lets have her get a chest x-ray.  Lets have her get sputum for bacterial culture.  Lets have her get a swab for viral respiratory pathogens.  Lets also repeat a COVID antigen test.  If she needs to be seen lets get her seen today.  She is an immunosuppressed patient and needs to be seen when sick.

## 2021-07-06 NOTE — Telephone Encounter (Signed)
Per Dr. Neldon Mc please arrange patient for immunoglobulin infusion starting this week to treat immunosuppression. Thank you.

## 2021-07-07 ENCOUNTER — Encounter: Payer: Self-pay | Admitting: Allergy and Immunology

## 2021-07-07 ENCOUNTER — Ambulatory Visit
Admission: RE | Admit: 2021-07-07 | Discharge: 2021-07-07 | Disposition: A | Discharge status: Home / Self Care | Payer: 59 | Source: Ambulatory Visit | Attending: Allergy and Immunology | Admitting: Allergy and Immunology

## 2021-07-08 ENCOUNTER — Telehealth: Payer: Self-pay | Admitting: *Deleted

## 2021-07-08 NOTE — Telephone Encounter (Signed)
-----   Message from Jiles Prows, MD sent at 07/07/2021  2:33 PM EDT ----- Lets give her 40 gm infusion ----- Message ----- From: Carin Hock, CMA Sent: 07/07/2021  11:54 AM EDT To: Jiles Prows, MD  Started approval for Privigen. Do you want her to have '400mg'$  per kg -30 grams or '600mg'$  per kg-45 grams Bunny Lowdermilk ----- Message ----- From: Jiles Prows, MD Sent: 07/06/2021   6:27 PM EDT To: Carin Hock, CMA  We need to get her immunoglobulin infusion this week.  Even if we have to do IV Privigen she needs that.  She has consistently low IgG and very low IgM as a result of her rituximab use and she suffers from recurrent infections especially around the time of her rituximab use and this issue has been going on now for years.  She saw an immunologist who has been treating her with suppressive antibiotics using azithromycin 3 times per week.

## 2021-07-08 NOTE — Telephone Encounter (Signed)
Spoke to patient and advised no specialty medication benefit and will submit for Hizentra PAP

## 2021-07-08 NOTE — Telephone Encounter (Signed)
Called patient and advised unable to get approval for Privigen or Hizentra patient Ins plan excludes specialty medications.  Will submit her to [facility] PAP for Hizentra. Dr Neldon Mc advised of situation also

## 2021-07-09 ENCOUNTER — Telehealth: Payer: Self-pay | Admitting: *Deleted

## 2021-07-09 NOTE — Telephone Encounter (Signed)
After trying again thru patient medical coverage for Hizentra I got I approval and advised patient of same and submit to [facility]

## 2021-07-16 ENCOUNTER — Telehealth: Payer: Self-pay | Admitting: *Deleted

## 2021-07-16 NOTE — Telephone Encounter (Signed)
Spoke to patient and she advised she has not start Hizentra yet and is out of Avelox after today.  I spoke to my contact at [facility] and he is going to make her case urgent and hopefully get her by first of next week.  Per Dr Neldon Mc he does not want her to take additional Avelox at this time but wati on infusion. Patient advised

## 2021-07-23 ENCOUNTER — Ambulatory Visit: Payer: 59 | Admitting: Allergy and Immunology

## 2021-08-04 ENCOUNTER — Other Ambulatory Visit: Payer: Self-pay | Admitting: Allergy and Immunology

## 2021-08-05 ENCOUNTER — Ambulatory Visit: Payer: 59 | Admitting: Allergy and Immunology

## 2021-08-20 ENCOUNTER — Ambulatory Visit (INDEPENDENT_AMBULATORY_CARE_PROVIDER_SITE_OTHER): Payer: 59 | Admitting: Allergy and Immunology

## 2021-08-20 ENCOUNTER — Other Ambulatory Visit: Payer: Self-pay

## 2021-08-20 ENCOUNTER — Encounter: Payer: Self-pay | Admitting: Allergy and Immunology

## 2021-08-20 VITALS — BP 124/82 | HR 67 | Resp 16

## 2021-08-20 DIAGNOSIS — D849 Immunodeficiency, unspecified: Secondary | ICD-10-CM | POA: Diagnosis not present

## 2021-08-20 DIAGNOSIS — D801 Nonfamilial hypogammaglobulinemia: Secondary | ICD-10-CM

## 2021-08-20 DIAGNOSIS — J4540 Moderate persistent asthma, uncomplicated: Secondary | ICD-10-CM | POA: Diagnosis not present

## 2021-08-20 DIAGNOSIS — J3089 Other allergic rhinitis: Secondary | ICD-10-CM | POA: Diagnosis not present

## 2021-08-20 NOTE — Patient Instructions (Addendum)
  1.  Continue to Treat and prevent inflammation:   A.  Symbicort 160 -2 inhalations twice a day with a spacer  B.  Fluticasone + Azelastine - 1 spray each nostril two times a day.     C.  DISCONTINUE Montelukast  2.  Continue to Treat and prevent reflux:   A.  Consolidate caffeine and chocolate consumption  B.  Famotidine 40mg  - 1 tablet 2 times per day  3.  Continue to treat hypogammaglobulinemia:   A.  Hizentra infusions - 10 gms per week  4. If needed:   A. Albuterol HFA - 2 inhalations every 4-6 hours  B. Nasal saline  5. Obtain flu vaccine  6. Return to clinic in January 2023 or earlier if problem. Check IgG with visit

## 2021-08-20 NOTE — Progress Notes (Signed)
[facility] [facility] [place] [facility] [place]   Follow-up Note  Referring Provider: Antony Contras, MD Primary Provider: Antony Contras, MD Date of Office Visit: 08/20/2021  Subjective:   Mary Odom (DOB: [birth year]) is a 60 y.o. 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  Her last visit to this clinic was 06 July 2021.  She is feeling great.  She has no cough and no other significant respiratory tract symptoms and does not use a short acting bronchodilator and overall just feels better in general.  Her reflux appears to be under very good control.  She has not required a systemic steroid or antibiotic for any type of infectious or inflammatory issue.  Allergies as of 08/20/2021       Reactions   Latex Rash        Medication List    albuterol 108 (90 Base) MCG/ACT inhaler Commonly known as: VENTOLIN HFA Can inhale two puffs every four to six hours as needed for cough or wheeze.   aspirin 81 MG tablet Take 81 mg by mouth daily.   azelastine 0.05 % ophthalmic solution Commonly known as: OPTIVAR   azelastine 0.1 % nasal spray Commonly known as: ASTELIN PLACE 1 SPRAY INTO BOTH NOSTRILS 2 TIMES A DAY.   benzonatate 200 MG capsule Commonly known as: TESSALON Take 200 mg by mouth 3 (three) times daily as needed.   cetirizine 10 MG tablet Commonly known as: ZYRTEC Take 10 mg by mouth daily.   famotidine 40 MG tablet Commonly known as: PEPCID Take one tablet by mouth twice daily as directed.   Fish Oil 1000 MG Caps Take 1 capsule by mouth daily.   fluticasone 50 MCG/ACT nasal spray Commonly known as: FLONASE Place 1 spray into both nostrils 2 (two) times daily.   gabapentin 300 MG capsule Commonly known  as: NEURONTIN Take 300 mg by mouth at bedtime.   ibuprofen 200 MG tablet Commonly known as: ADVIL Take 400 mg by mouth daily.   Lutein-Zeaxanthin 25-5 MG Caps lutein-zeaxanthin   MIRALAX PO Miralax   modafinil 200 MG tablet Commonly known as: PROVIGIL Take 200 mg by mouth daily.   montelukast 10 MG tablet Commonly known as: SINGULAIR TAKE 1 TABLET ONCE DAILY.   ondansetron 4 MG tablet Commonly known as: ZOFRAN Take 4 mg by mouth every 8 (eight) hours as needed.   Psyllium 0.36 g Caps Metamucil   pyridOXINE 50 MG tablet Commonly known as: VITAMIN B-6 Take 50 mg by mouth daily.   Restora RX 60-1.25 MG Caps Generic drug: Lactobacillus Casei-Folic Acid Take 1 capsule by mouth daily.   riTUXimab 100 MG/10ML injection Commonly known as: RITUXAN   RUXIENCE IV Inject into the vein. Last infusion on May 5th, 2022   simvastatin 20 MG tablet Commonly known as: ZOCOR Take 20 mg by mouth at bedtime.   Symbicort 160-4.5 MCG/ACT inhaler Generic drug: budesonide-formoterol USE 2 PUFFS TWICE A DAY.   temazepam 15 MG capsule Commonly known as: RESTORIL Take 15 mg by mouth at bedtime as needed for sleep.   Vitamin D (Ergocalciferol) 1.25 MG (50000 UNIT) Caps capsule Commonly known as: DRISDOL Take 2,000 Units by mouth daily.    Past Medical History:  Diagnosis Date   Asthma, well controlled 08/29/2019   High cholesterol  MS (multiple sclerosis) ([place])    Dx 1986    Past Surgical History:  Procedure Laterality Date   COLONOSCOPY  2012   2 benign polyps   MYRINGOTOMY WITH TUBE PLACEMENT Left 09/04/2019   WISDOM TOOTH EXTRACTION      Review of systems negative except as noted in HPI / PMHx or noted below:  Review of Systems  Constitutional: Negative.   HENT: Negative.    Eyes: Negative.   Respiratory: Negative.    Cardiovascular: Negative.   Gastrointestinal: Negative.   Genitourinary: Negative.   Musculoskeletal: Negative.   Skin: Negative.    Neurological: Negative.   Endo/Heme/Allergies: Negative.   Psychiatric/Behavioral: Negative.      Objective:   Vitals:   08/20/21 1339  BP: 124/82  Pulse: 67  Resp: 16  SpO2: 97%          Physical Exam Constitutional:      Appearance: She is not diaphoretic.  HENT:     Head: Normocephalic.     Right Ear: Tympanic membrane, ear canal and external ear normal. A PE tube is present.     Left Ear: Tympanic membrane, ear canal and external ear normal. A PE tube is present.     Nose: Nose normal. No mucosal edema or rhinorrhea.     Mouth/Throat:     Pharynx: Uvula midline. No oropharyngeal exudate.  Eyes:     Conjunctiva/sclera: Conjunctivae normal.  Neck:     Thyroid: No thyromegaly.     Trachea: Trachea normal. No tracheal tenderness or tracheal deviation.  Cardiovascular:     Rate and Rhythm: Normal rate and regular rhythm.     Heart sounds: Normal heart sounds, S1 normal and S2 normal. No murmur heard. Pulmonary:     Effort: No respiratory distress.     Breath sounds: Normal breath sounds. No stridor. No wheezing or rales.  Lymphadenopathy:     Head:     Right side of head: No tonsillar adenopathy.     Left side of head: No tonsillar adenopathy.     Cervical: No cervical adenopathy.  Skin:    Findings: No erythema or rash.     Nails: There is no clubbing.  Neurological:     Mental Status: She is alert.    Diagnostics:    Spirometry was performed and demonstrated an FEV1 of 1.97 at 79 % of predicted.   Assessment and Plan:   1. Hypogammaglobulinemia ([place])   2. Immunosuppression ([place])   3. Asthma, moderate persistent, well-controlled   4. Perennial allergic rhinitis     1.  Continue to Treat and prevent inflammation:   A.  Symbicort 160 -2 inhalations twice a day with a spacer  B.  Fluticasone + Azelastine - 1 spray each nostril two times a day.     C.  DISCONTINUE Montelukast  2.  Continue to Treat and prevent reflux:   A.  Consolidate caffeine and  chocolate consumption  B.  Famotidine 40mg  - 1 tablet 2 times per day  3.  Continue to treat hypogammaglobulinemia:   A.  Hizentra infusions - 10 gm per week  4. If needed:   A. Albuterol HFA - 2 inhalations every 4-6 hours  B. Nasal saline  5. Obtain flu vaccine  6. Return to clinic in January 2023 or earlier if problem. Check IgG level with visit  Mallorie appears to be doing much better at this point in time and I suspect that this is secondary to her Hizentra infusions.  We will keep her on this immunoglobulin as well as having her continue to use anti-inflammatory agents for her airway and therapy against reflux.  We will attempt to consolidate some of her treatment by discontinuing her montelukast today.  Assuming she does well I will see her back in this clinic in January 2023 or earlier if there is a problem.  During her next visit we will check her immunoglobulin levels.  Allena Katz, MD Allergy / Immunology [place]

## 2021-08-24 ENCOUNTER — Encounter: Payer: Self-pay | Admitting: Allergy and Immunology

## 2021-09-15 ENCOUNTER — Other Ambulatory Visit: Payer: Self-pay | Admitting: Allergy and Immunology

## 2021-10-25 ENCOUNTER — Other Ambulatory Visit: Payer: Self-pay | Admitting: Allergy and Immunology

## 2021-12-21 ENCOUNTER — Encounter: Payer: Self-pay | Admitting: Allergy and Immunology

## 2021-12-21 ENCOUNTER — Ambulatory Visit (INDEPENDENT_AMBULATORY_CARE_PROVIDER_SITE_OTHER): Payer: 59 | Admitting: Allergy and Immunology

## 2021-12-21 ENCOUNTER — Other Ambulatory Visit: Payer: Self-pay

## 2021-12-21 VITALS — BP 140/82 | HR 64 | Resp 20

## 2021-12-21 DIAGNOSIS — J3089 Other allergic rhinitis: Secondary | ICD-10-CM | POA: Diagnosis not present

## 2021-12-21 DIAGNOSIS — J4540 Moderate persistent asthma, uncomplicated: Secondary | ICD-10-CM

## 2021-12-21 DIAGNOSIS — K219 Gastro-esophageal reflux disease without esophagitis: Secondary | ICD-10-CM

## 2021-12-21 DIAGNOSIS — D801 Nonfamilial hypogammaglobulinemia: Secondary | ICD-10-CM

## 2021-12-21 MED ORDER — LIDOCAINE-PRILOCAINE 2.5-2.5 % EX CREA: TOPICAL_CREAM | CUTANEOUS | 0 refills | Status: DC

## 2021-12-21 NOTE — Patient Instructions (Addendum)
°  1.  Continue to Treat and prevent inflammation:   A.  Symbicort 160 -2 inhalations twice a day with a spacer  B.  Fluticasone + Azelastine - 1 spray each nostril 1-2 times a day.     2.  Continue to Treat and prevent reflux:   A.  Consolidate caffeine and chocolate consumption  B.  Famotidine 40mg  - 1 tablet 1-2 times per day  3.  Continue to treat hypogammaglobulinemia:   A.  Hizentra infusions - 10 gms per week  4. If needed:   A. Albuterol HFA - 2 inhalations every 4-6 hours  B. Nasal saline  C. Antihistamine  D. EMLA cream before injections (15-20 minutes)  5. Blood - IgA/G/M  6. Return to clinic in 6 months or earlier if problem

## 2021-12-21 NOTE — Progress Notes (Signed)
[place] [place] [facility] [place]   Follow-up Note  Referring Provider: Antony Contras, MD Primary Provider: Antony Contras, MD Date of Office Visit: 12/21/2021  Subjective:   Mary Odom (DOB: [birth year]) is a 61 y.o. female who returns to the [facility] on 12/21/2021 in re-evaluation of the following:  HPI: Mary Odom returns to this clinic in evaluation of hypogammaglobulinemia secondary to rituximab use, asthma, allergic rhinoconjunctivitis, LPR.  Her last visit to this clinic was 20 August 2021.  She has done wonderful since her last visit without the need for systemic steroid or antibiotic for any type of infectious disease or respiratory disease.  She does not have any coughing or upper airway symptoms and her use of a short acting bronchodilator is 0.  Her reflux is under excellent control on her current plan.  The only complaint she has at this point is the fact that her injections for subcutaneous Hizentra are painful at the initiation of the injection procedure.  After she gets injected she has no problems with the infusion.  She has had 4 COVID vaccines and has received this year's flu vaccine.  Allergies as of 12/21/2021       Reactions   Latex Rash        Medication List    albuterol 108 (90 Base) MCG/ACT inhaler Commonly known as: VENTOLIN HFA Can inhale two puffs every four to six hours as needed for cough or wheeze.   aspirin 81 MG tablet Take 81 mg by mouth daily.   azelastine 0.05 % ophthalmic solution Commonly known as: OPTIVAR   Azelastine HCl 137 MCG/SPRAY Soln PLACE 1 SPRAY INTO BOTH NOSTRILS 2 TIMES A DAY.   cetirizine 10 MG tablet Commonly known as: ZYRTEC Take 10 mg by mouth daily.   famotidine 40 MG tablet Commonly known as: PEPCID TAKE ONE TABLET BY MOUTH TWICE DAILY AS DIRECTED   Fish Oil 1000 MG Caps Take 1 capsule by mouth daily.   fluticasone 50 MCG/ACT nasal spray Commonly known as:  FLONASE Place 1 spray into both nostrils 2 (two) times daily.   gabapentin 300 MG capsule Commonly known as: NEURONTIN Take 300 mg by mouth at bedtime.   Hizentra 10 GM/50ML Soln Generic drug: Immune Globulin (Human) See admin instructions.   ibuprofen 200 MG tablet Commonly known as: ADVIL Take 400 mg by mouth daily.   Lutein-Zeaxanthin 25-5 MG Caps lutein-zeaxanthin   MIRALAX PO Miralax   modafinil 200 MG tablet Commonly known as: PROVIGIL Take 200 mg by mouth daily.   Psyllium 0.36 g Caps Metamucil   pyridOXINE 50 MG tablet Commonly known as: VITAMIN B-6 Take 50 mg by mouth daily.   riTUXimab 100 MG/10ML injection Commonly known as: RITUXAN   RUXIENCE IV Inject into the vein. Last infusion on May 5th, 2022   simvastatin 20 MG tablet Commonly known as: ZOCOR Take 20 mg by mouth at bedtime.   Symbicort 160-4.5 MCG/ACT inhaler Generic drug: budesonide-formoterol USE 2 PUFFS TWICE A DAY.   temazepam 15 MG capsule Commonly known as: RESTORIL Take 15 mg by mouth at bedtime as needed for sleep.   Vitamin D (Ergocalciferol) 1.25 MG (50000 UNIT) Caps capsule Commonly known as: DRISDOL Take 2,000 Units by mouth daily.    Past Medical History:  Diagnosis Date   Asthma, well controlled 08/29/2019   High cholesterol    MS (multiple sclerosis) ([place])    Dx 1986    Past Surgical History:  Procedure  Laterality Date   COLONOSCOPY  2012   2 benign polyps   MYRINGOTOMY WITH TUBE PLACEMENT Left 09/04/2019   WISDOM TOOTH EXTRACTION      Review of systems negative except as noted in HPI / PMHx or noted below:  Review of Systems  Constitutional: Negative.   HENT: Negative.    Eyes: Negative.   Respiratory: Negative.    Cardiovascular: Negative.   Gastrointestinal: Negative.   Genitourinary: Negative.   Musculoskeletal: Negative.   Skin: Negative.   Neurological: Negative.   Endo/Heme/Allergies: Negative.   Psychiatric/Behavioral: Negative.       Objective:   Vitals:   12/21/21 1352  BP: 140/82  Pulse: 64  Resp: 20  SpO2: 98%          Physical Exam Constitutional:      Appearance: She is not diaphoretic.  HENT:     Head: Normocephalic.     Right Ear: Ear canal and external ear normal. Tympanic membrane is scarred.     Left Ear: Ear canal and external ear normal. A PE tube is present. Tympanic membrane is scarred.     Nose: Nose normal. No mucosal edema or rhinorrhea.     Mouth/Throat:     Pharynx: Uvula midline. No oropharyngeal exudate.  Eyes:     Conjunctiva/sclera: Conjunctivae normal.  Neck:     Thyroid: No thyromegaly.     Trachea: Trachea normal. No tracheal tenderness or tracheal deviation.  Cardiovascular:     Rate and Rhythm: Normal rate and regular rhythm.     Heart sounds: Normal heart sounds, S1 normal and S2 normal. No murmur heard. Pulmonary:     Effort: No respiratory distress.     Breath sounds: Normal breath sounds. No stridor. No wheezing or rales.  Lymphadenopathy:     Head:     Right side of head: No tonsillar adenopathy.     Left side of head: No tonsillar adenopathy.     Cervical: No cervical adenopathy.  Skin:    Findings: No erythema or rash.     Nails: There is no clubbing.  Neurological:     Mental Status: She is alert.    Diagnostics:    Spirometry was performed and demonstrated an FEV1 of 1.48 at 60 % of predicted.  Assessment and Plan:   1. Hypogammaglobulinemia ([place])   2. Asthma, moderate persistent, well-controlled   3. Perennial allergic rhinitis   4. LPRD (laryngopharyngeal reflux disease)     1.  Continue to Treat and prevent inflammation:   A.  Symbicort 160 -2 inhalations twice a day with a spacer  B.  Fluticasone + Azelastine - 1 spray each nostril 1-2 times a day.     2.  Continue to Treat and prevent reflux:   A.  Consolidate caffeine and chocolate consumption  B.  Famotidine 40mg  - 1 tablet 1-2 times per day  3.  Continue to treat  hypogammaglobulinemia:   A.  Hizentra infusions - 10 gms per week  4. If needed:   A. Albuterol HFA - 2 inhalations every 4-6 hours  B. Nasal saline  C. Antihistamine  D. EMLA cream before injections (15-20 minutes)  5. Blood - IgA/G/M  6. Return to clinic in 6 months or earlier if problem  Mary Odom is doing great and I think there is an opportunity to further consolidate her medical treatment and we will have her decrease her fluticasone and azelastine and famotidine as noted above.  As well, she does have some  pain using her Hizentra injections and we will let her use Emla as a preinjection treatment.  I will check her antibody levels today and see her back in this clinic in 6 months or earlier if there is a problem.  Allena Katz, MD Allergy / Immunology [place]

## 2021-12-22 ENCOUNTER — Encounter: Payer: Self-pay | Admitting: Allergy and Immunology

## 2021-12-25 LAB — IGG, IGA, IGM
IgA/Immunoglobulin A, Serum: 113 mg/dL (ref 87–352)
IgG (Immunoglobin G), Serum: 1037 mg/dL (ref 586–1602)
IgM (Immunoglobulin M), Srm: 25 mg/dL — ABNORMAL LOW (ref 26–217)

## 2022-01-08 ENCOUNTER — Other Ambulatory Visit: Payer: Self-pay | Admitting: Allergy and Immunology

## 2022-04-19 ENCOUNTER — Other Ambulatory Visit: Payer: Self-pay | Admitting: Allergy and Immunology

## 2022-04-22 ENCOUNTER — Other Ambulatory Visit: Payer: Self-pay | Admitting: *Deleted

## 2022-04-22 DIAGNOSIS — G35 Multiple sclerosis: Secondary | ICD-10-CM

## 2022-05-06 ENCOUNTER — Other Ambulatory Visit: Payer: Self-pay | Admitting: Allergy and Immunology

## 2022-05-10 ENCOUNTER — Ambulatory Visit
Admission: RE | Admit: 2022-05-10 | Discharge: 2022-05-10 | Disposition: A | Discharge status: Home / Self Care | Payer: 59 | Source: Ambulatory Visit | Attending: *Deleted | Admitting: *Deleted

## 2022-05-10 DIAGNOSIS — G35 Multiple sclerosis: Secondary | ICD-10-CM

## 2022-05-10 MED ORDER — GADOBENATE DIMEGLUMINE 529 MG/ML IV SOLN
15.0000 mL | Freq: Once | INTRAVENOUS | Status: AC | PRN
  Administered 2022-05-10: 15 mL via INTRAVENOUS

## 2022-06-23 ENCOUNTER — Ambulatory Visit: Payer: 59 | Admitting: Allergy and Immunology

## 2022-06-30 ENCOUNTER — Ambulatory Visit (INDEPENDENT_AMBULATORY_CARE_PROVIDER_SITE_OTHER): Payer: 59 | Admitting: Allergy and Immunology

## 2022-06-30 ENCOUNTER — Encounter: Payer: Self-pay | Admitting: Allergy and Immunology

## 2022-06-30 VITALS — BP 110/86 | HR 65 | Temp 98.4°F | Resp 16 | Ht 64.0 in | Wt 157.0 lb

## 2022-06-30 DIAGNOSIS — D801 Nonfamilial hypogammaglobulinemia: Secondary | ICD-10-CM

## 2022-06-30 DIAGNOSIS — K219 Gastro-esophageal reflux disease without esophagitis: Secondary | ICD-10-CM

## 2022-06-30 DIAGNOSIS — Z825 Family history of asthma and other chronic lower respiratory diseases: Secondary | ICD-10-CM

## 2022-06-30 DIAGNOSIS — J4540 Moderate persistent asthma, uncomplicated: Secondary | ICD-10-CM | POA: Diagnosis not present

## 2022-06-30 DIAGNOSIS — J3089 Other allergic rhinitis: Secondary | ICD-10-CM | POA: Diagnosis not present

## 2022-06-30 DIAGNOSIS — D849 Immunodeficiency, unspecified: Secondary | ICD-10-CM

## 2022-06-30 NOTE — Patient Instructions (Addendum)
  1.  Continue to Treat and prevent inflammation:   A.  Symbicort 160 -2 inhalations twice a day with a spacer  B.  Fluticasone + Azelastine - 1 spray each nostril 1-2 times a day C.  Prednisone 10 mg - 1 tablet 1 time per day for 10 days only   2.  Continue to Treat and prevent reflux:   A.  Consolidate caffeine and chocolate consumption  B.  Famotidine '40mg'$  - 1 tablet 1-2 times per day  3.  Continue to treat hypogammaglobulinemia:   A.  Hizentra infusions - 10 gms per week  4. If needed:   A. Albuterol HFA - 2 inhalations every 4-6 hours  B. Nasal saline  C. Antihistamine  D. EMLA cream before injections (15-20 minutes)  5. Blood -alpha-1 antitrypsin level and phenotype  6. Return to clinic in 6 months or earlier if problem  7. Fall flu vaccine and RSV vaccine

## 2022-06-30 NOTE — Progress Notes (Unsigned)
[facility] [place] [facility] [place]   Follow-up Note  Referring Provider: Antony Contras, MD Primary Provider: Antony Contras, MD Date of Office Visit: 06/30/2022  Subjective:   Mary Odom (DOB: [birth year]) is a 61 y.o. female who returns to the [facility] on 06/30/2022 in re-evaluation of the following:  HPI: Krystin returns to this clinic in evaluation of hypogammaglobulinemia in the context of rituximab use, asthma, allergic rhinoconjunctivitis, LPR.  Her last visit to this clinic was 21 December 2021.  She has not had an infection since her last visit and has not required an antibiotic.  She continues on immunoglobulin infusions.  She has been using Emla at insertion sites and this has worked great preventing her from having pain during this procedure.  Her breathing is doing quite well.  She does not need to use a short acting bronchodilator.  She continues on Symbicort twice a day.  She has developed a problem over the course of the past month with her upper airway.  She feels as though she is a little congested and she has been having drainage on the left side and the right side.  This drainage is always clear.  She does not have any anosmia or headaches.  She has been using nasal antihistamines and nasal steroids.  She informs me about the status of her mom's health.  Apparently her mom has been diagnosed with COPD complicated by bronchiectasis and hemoptysis and Mycobacterium infection.  Her mom is a non-smoker but may have had extensive secondhand tobacco smoke exposure.  Allergies as of 06/30/2022       Reactions   Latex Rash        Medication List    albuterol 108 (90 Base) MCG/ACT inhaler Commonly known as: VENTOLIN HFA Can inhale two puffs every four to six hours as needed for cough or wheeze.   aspirin 81 MG tablet Take 81 mg by mouth daily.   azelastine 0.05 % ophthalmic solution Commonly known as: OPTIVAR   azelastine 0.1  % nasal spray Commonly known as: ASTELIN PLACE 1 SPRAY INTO BOTH NOSTRILS 2 TIMES A DAY.   cetirizine 10 MG tablet Commonly known as: ZYRTEC Take 10 mg by mouth daily.   famotidine 40 MG tablet Commonly known as: PEPCID TAKE 1 TABLET DAILY AS DIRECTED.   Fish Oil 1000 MG Caps Take 1 capsule by mouth daily.   fluticasone 50 MCG/ACT nasal spray Commonly known as: FLONASE Place 1 spray into both nostrils 2 (two) times daily.   gabapentin 300 MG capsule Commonly known as: NEURONTIN Take 300 mg by mouth at bedtime.   Hizentra 10 GM/50ML Soln Generic drug: Immune Globulin (Human) See admin instructions.   ibuprofen 200 MG tablet Commonly known as: ADVIL Take 400 mg by mouth daily.   lidocaine-prilocaine cream Commonly known as: EMLA Apply to injection site as directed.   Lutein-Zeaxanthin 25-5 MG Caps lutein-zeaxanthin   MIRALAX PO Miralax   modafinil 200 MG tablet Commonly known as: PROVIGIL Take 200 mg by mouth daily.   montelukast 10 MG tablet Commonly known as: SINGULAIR Take 1 tablet by mouth daily.   Psyllium 0.36 g Caps Metamucil   pyridOXINE 50 MG tablet Commonly known as: VITAMIN B6 Take 50 mg by mouth daily.   riTUXimab 100 MG/10ML injection Commonly known as: RITUXAN   RUXIENCE IV Inject into the vein. Last infusion on May 5th, 2022   simvastatin 20 MG tablet Commonly known as: ZOCOR Take  20 mg by mouth at bedtime.   Symbicort 160-4.5 MCG/ACT inhaler Generic drug: budesonide-formoterol USE 2 PUFFS TWICE A DAY.   temazepam 15 MG capsule Commonly known as: RESTORIL Take 15 mg by mouth at bedtime as needed for sleep.   Vitamin D (Ergocalciferol) 1.25 MG (50000 UNIT) Caps capsule Commonly known as: DRISDOL Take 2,000 Units by mouth daily.    Past Medical History:  Diagnosis Date   Asthma, well controlled 08/29/2019   High cholesterol    MS (multiple sclerosis) ([place])    Dx 1986    Past Surgical History:  Procedure Laterality Date    COLONOSCOPY  2012   2 benign polyps   MYRINGOTOMY WITH TUBE PLACEMENT Left 09/04/2019   WISDOM TOOTH EXTRACTION      Review of systems negative except as noted in HPI / PMHx or noted below:  Review of Systems  Constitutional: Negative.   HENT: Negative.    Eyes: Negative.   Respiratory: Negative.    Cardiovascular: Negative.   Gastrointestinal: Negative.   Genitourinary: Negative.   Musculoskeletal: Negative.   Skin: Negative.   Neurological: Negative.   Endo/Heme/Allergies: Negative.   Psychiatric/Behavioral: Negative.       Objective:   Vitals:   06/30/22 1104  BP: 110/86  Pulse: 65  Resp: 16  Temp: 98.4 F (36.9 C)  SpO2: 98%   Height: '5\' 4"'$  (162.6 cm)  Weight: 157 lb (71.2 kg)   Physical Exam Constitutional:      Appearance: She is not diaphoretic.  HENT:     Head: Normocephalic.     Right Ear: Tympanic membrane, ear canal and external ear normal.     Left Ear: Tympanic membrane, ear canal and external ear normal. A PE tube is present.     Nose: Nose normal. No mucosal edema or rhinorrhea.     Mouth/Throat:     Pharynx: Uvula midline. No oropharyngeal exudate.  Eyes:     Conjunctiva/sclera: Conjunctivae normal.  Neck:     Thyroid: No thyromegaly.     Trachea: Trachea normal. No tracheal tenderness or tracheal deviation.  Cardiovascular:     Rate and Rhythm: Normal rate and regular rhythm.     Heart sounds: Normal heart sounds, S1 normal and S2 normal. No murmur heard. Pulmonary:     Effort: No respiratory distress.     Breath sounds: Normal breath sounds. No stridor. No wheezing or rales.  Lymphadenopathy:     Head:     Right side of head: No tonsillar adenopathy.     Left side of head: No tonsillar adenopathy.     Cervical: No cervical adenopathy.  Skin:    Findings: No erythema or rash.     Nails: There is no clubbing.  Neurological:     Mental Status: She is alert.     Diagnostics:    Spirometry was performed and demonstrated an FEV1  of 1.20 at 49 % of predicted.FEV1/FVC = 0.86   Assessment and Plan:   1. Hypogammaglobulinemia ([place])   2. Immunosuppression ([place])   3. Asthma, moderate persistent, well-controlled   4. Perennial allergic rhinitis   5. LPRD (laryngopharyngeal reflux disease)   6. Family history of COPD (chronic obstructive pulmonary disease)     1.  Continue to Treat and prevent inflammation:   A.  Symbicort 160 -2 inhalations twice a day with a spacer  B.  Fluticasone + Azelastine - 1 spray each nostril 1-2 times a day C.  Prednisone 10 mg - 1  tablet 1 time per day for 10 days only   2.  Continue to Treat and prevent reflux:   A.  Consolidate caffeine and chocolate consumption  B.  Famotidine '40mg'$  - 1 tablet 1-2 times per day  3.  Continue to treat hypogammaglobulinemia:   A.  Hizentra infusions - 10 gms per week  4. If needed:   A. Albuterol HFA - 2 inhalations every 4-6 hours  B. Nasal saline  C. Antihistamine  D. EMLA cream before injections (15-20 minutes)  5. Blood -alpha-1 antitrypsin level and phenotype  6. Return to clinic in 6 months or earlier if problem  7. Fall flu vaccine and RSV vaccine  It does appear as though Lesta has a little more upper airway inflammation that is developed over the course of the past month.  We will give her a systemic steroid at low-dose for 10 days and if this does not result in resolution of that issue then we will empirically treat her with an antibiotic and if that does not result in resolution of that issue then she will require an imaging procedure of her upper airway.  She will continue on immunoglobulin infusions which has really made her much healthier in general over the course of the past year and she will continue on a collection of anti-inflammatory agents for her airway and therapy directed against reflux.  Her mom's history of having COPD with bronchiectasis and hemoptysis and Mycobacterium infection does require that we check Julia for  alpha-1 antitrypsin deficiency and I will contact her with the results of the blood test once it is available for review.  Her spirometry is suppressed although she does not have symptoms to correlate with this abnormal spirometric maneuver.  If she continues to show a decrement in her spirometric measurements then we will need to image her airway.  Allena Katz, MD Allergy / Immunology [place]

## 2022-07-01 ENCOUNTER — Encounter: Payer: Self-pay | Admitting: Allergy and Immunology

## 2022-07-03 LAB — ALPHA-1-ANTITRYPSIN PHENOTYP: A-1 Antitrypsin: 119 mg/dL (ref 101–187)

## 2022-07-07 ENCOUNTER — Other Ambulatory Visit: Payer: Self-pay | Admitting: Allergy and Immunology

## 2022-07-12 ENCOUNTER — Encounter: Payer: Self-pay | Admitting: *Deleted

## 2022-07-13 ENCOUNTER — Encounter: Payer: Self-pay | Admitting: Allergy and Immunology

## 2022-07-15 ENCOUNTER — Other Ambulatory Visit: Payer: Self-pay

## 2022-07-15 MED ORDER — AMOXICILLIN-POT CLAVULANATE 875-125 MG PO TABS: 1.0000 | ORAL_TABLET | Freq: Two times a day (BID) | ORAL | 0 refills | Status: AC

## 2022-07-15 NOTE — Telephone Encounter (Signed)
Please let Mary Odom know that I received her message and because she continues to have problems with her nose and has not cleared this out completely we will need to give her a broad-spectrum antibiotic for 14 days.  She can use Augmentin 875 1 tablet twice a day for 14 days.

## 2022-07-28 ENCOUNTER — Telehealth: Payer: Self-pay | Admitting: *Deleted

## 2022-07-28 NOTE — Telephone Encounter (Signed)
Patient called and advised she read article that benadyl could cause dementia and she takes it once weekly with her Hizentra. She wants to know your option on taking it for extended periods

## 2022-07-29 NOTE — Telephone Encounter (Signed)
Per Dr Neldon Mc intermittent use of benadryl should not be problem just consistent use. Patient advised of same

## 2022-09-24 ENCOUNTER — Encounter: Payer: Self-pay | Admitting: Allergy and Immunology

## 2022-10-19 ENCOUNTER — Encounter: Payer: Self-pay | Admitting: Allergy and Immunology

## 2022-10-19 ENCOUNTER — Ambulatory Visit (INDEPENDENT_AMBULATORY_CARE_PROVIDER_SITE_OTHER): Payer: 59 | Admitting: Allergy and Immunology

## 2022-10-19 VITALS — BP 122/80 | HR 73 | Temp 98.1°F | Resp 16 | Ht 64.0 in | Wt 166.3 lb

## 2022-10-19 DIAGNOSIS — D801 Nonfamilial hypogammaglobulinemia: Secondary | ICD-10-CM

## 2022-10-19 NOTE — Progress Notes (Signed)
Mary Odom presents to this clinic to discuss her rituximab use for her MS.  Her new neurologist would like to alter her rituximab utilizing a smoother administration over the course of the year with fewer milligrams in her current pulsed dosing.  He believes that by using this plan we may be able to sustain a better B-cell immune system and antibody production and possibly avert the use of subcutaneous immunoglobulin infusion.  I informed Candy that she should work through her neurologist to alter the dose of rituximab and we will see what happens over the course of the next 6 months while she has had the altered dose of rituximab and then make a decision about subcutaneously immunoglobulin infusion.

## 2022-10-20 ENCOUNTER — Encounter: Payer: Self-pay | Admitting: Allergy and Immunology

## 2022-11-01 ENCOUNTER — Encounter: Payer: Self-pay | Admitting: Allergy and Immunology

## 2022-11-05 ENCOUNTER — Other Ambulatory Visit: Payer: Self-pay | Admitting: Allergy and Immunology

## 2022-12-01 ENCOUNTER — Encounter: Payer: Self-pay | Admitting: Allergy and Immunology

## 2022-12-01 MED ORDER — PAXLOVID (300/100) 20 X 150 MG & 10 X 100MG PO TBPK: ORAL_TABLET | ORAL | 0 refills | Status: DC

## 2022-12-01 NOTE — Telephone Encounter (Signed)
-----   Message from Jiles Prows, MD sent at 12/01/2022  5:03 PM EST ----- Please send a prescription for Paxlovid Dosepak to [place]

## 2023-01-05 ENCOUNTER — Other Ambulatory Visit: Payer: Self-pay | Admitting: Allergy and Immunology

## 2023-01-10 ENCOUNTER — Encounter: Payer: Self-pay | Admitting: Allergy and Immunology

## 2023-02-07 ENCOUNTER — Other Ambulatory Visit: Payer: Self-pay | Admitting: Allergy and Immunology

## 2023-04-06 ENCOUNTER — Encounter: Payer: Self-pay | Admitting: Allergy and Immunology

## 2023-04-07 ENCOUNTER — Other Ambulatory Visit: Payer: Self-pay | Admitting: *Deleted

## 2023-04-07 MED ORDER — AMOXICILLIN-POT CLAVULANATE 875-125 MG PO TABS: 1.0000 | ORAL_TABLET | Freq: Two times a day (BID) | ORAL | 0 refills | Status: AC

## 2023-04-19 ENCOUNTER — Ambulatory Visit (INDEPENDENT_AMBULATORY_CARE_PROVIDER_SITE_OTHER): Payer: 59 | Admitting: Allergy and Immunology

## 2023-04-19 ENCOUNTER — Encounter: Payer: Self-pay | Admitting: Allergy and Immunology

## 2023-04-19 ENCOUNTER — Other Ambulatory Visit: Payer: Self-pay

## 2023-04-19 VITALS — BP 130/82 | HR 73 | Temp 99.5°F | Resp 18 | Ht 64.57 in | Wt 165.4 lb

## 2023-04-19 DIAGNOSIS — J4540 Moderate persistent asthma, uncomplicated: Secondary | ICD-10-CM | POA: Diagnosis not present

## 2023-04-19 DIAGNOSIS — D801 Nonfamilial hypogammaglobulinemia: Secondary | ICD-10-CM | POA: Diagnosis not present

## 2023-04-19 DIAGNOSIS — K219 Gastro-esophageal reflux disease without esophagitis: Secondary | ICD-10-CM

## 2023-04-19 DIAGNOSIS — J3089 Other allergic rhinitis: Secondary | ICD-10-CM

## 2023-04-19 DIAGNOSIS — D849 Immunodeficiency, unspecified: Secondary | ICD-10-CM

## 2023-04-19 MED ORDER — FLUTICASONE PROPIONATE 50 MCG/ACT NA SUSP: NASAL | 5 refills | Status: DC

## 2023-04-19 MED ORDER — AZELASTINE HCL 0.1 % NA SOLN: NASAL | 5 refills | Status: DC

## 2023-04-19 MED ORDER — FAMOTIDINE 40 MG PO TABS: ORAL_TABLET | ORAL | 5 refills | Status: DC

## 2023-04-19 MED ORDER — SYMBICORT 160-4.5 MCG/ACT IN AERO: 2.0000 | INHALATION_SPRAY | Freq: Two times a day (BID) | RESPIRATORY_TRACT | 5 refills | Status: DC

## 2023-04-19 MED ORDER — ALBUTEROL SULFATE HFA 108 (90 BASE) MCG/ACT IN AERS: INHALATION_SPRAY | RESPIRATORY_TRACT | 1 refills | Status: DC

## 2023-04-19 NOTE — Progress Notes (Unsigned)
[place] - [place] - [place] - [place] - [place]   Follow-up Note  Referring Provider: Tally Joe, MD Primary Provider: Tally Joe, MD Date of Office Visit: 04/19/2023  Subjective:   Mary Odom (DOB: 08/20/1961) is a 62 y.o. 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  Her last visit to this clinic was 30 June 2022.  She is really doing very well and other than contracting 1 episode of sinusitis requiring administration of Augmentin with no long-term sequela since her last visit she believes that her airway is doing well regarding both her upper and lower airway and she has not had many infections and overall is feeling relatively good while she continues on Symbicort and nasal fluticasone and azelastine and immunoglobulin infusions until March 2024.  As per the recommendation of her neurologist, she has had her rituximab administration changed to a more frequent lower dose regime and she had her immunoglobulin infusions discontinued March 2024.  Her reflux has been under excellent control while using famotidine.  Allergies as of 04/19/2023       Reactions   Latex Rash        Medication List    albuterol 108 (90 Base) MCG/ACT inhaler Commonly known as: VENTOLIN HFA Can inhale two puffs every four to six hours as needed for cough or wheeze.   amoxicillin-clavulanate 875-125 MG tablet Commonly known as: AUGMENTIN Take 1 tablet by mouth 2 (two) times daily for 14 days.   aspirin 81 MG tablet Take 81 mg by mouth daily.   azelastine 0.05 % ophthalmic solution Commonly known as: OPTIVAR   azelastine 0.1 % nasal spray Commonly known as: ASTELIN PLACE 1 SPRAY INTO BOTH NOSTRILS 2 TIMES A DAY.   azelastine 0.1 % nasal spray Commonly known as:  ASTELIN 1 spray each nostril 1-2 times daily.   cetirizine 10 MG tablet Commonly known as: ZYRTEC Take 10 mg by mouth daily.   famotidine 40 MG tablet Commonly known as: PEPCID 1 tablet 1-2 times daily.   Fish Oil 1000 MG Caps Take 1 capsule by mouth daily.   fluticasone 50 MCG/ACT nasal spray Commonly known as: FLONASE Place 1 spray into both nostrils 2 (two) times daily.   fluticasone 50 MCG/ACT nasal spray Commonly known as: FLONASE 1 spray each nostril 1-2 times daily.   gabapentin 300 MG capsule Commonly known as: NEURONTIN Take 300 mg by mouth at bedtime.   Lutein-Zeaxanthin 25-5 MG Caps lutein-zeaxanthin   meloxicam 7.5 MG tablet Commonly known as: MOBIC Take 7.5 mg by mouth daily.   MIRALAX PO Miralax   modafinil 200 MG tablet Commonly known as: PROVIGIL Take 200 mg by mouth daily.   Psyllium 0.36 g Caps Metamucil   pyridOXINE 50 MG tablet Commonly known as: VITAMIN B6 Take 50 mg by mouth daily.   riTUXimab 100 MG/10ML injection Commonly known as: RITUXAN   RUXIENCE IV Inject into the vein. Last infusion on May 5th, 2022   simvastatin 20 MG tablet Commonly known as: ZOCOR Take 20 mg by mouth at bedtime.   Symbicort 160-4.5 MCG/ACT inhaler Generic drug: budesonide-formoterol Inhale 2 puffs into the lungs 2 (two) times daily.   temazepam 15 MG capsule Commonly known as: RESTORIL Take 15 mg by mouth at bedtime as needed for sleep.   Vitamin D (Ergocalciferol) 1.25 MG (  50000 UNIT) Caps capsule Commonly known as: DRISDOL Take 2,000 Units by mouth daily.   Voltaren 1 % Gel Generic drug: diclofenac Sodium Apply 2 g topically 4 (four) times daily.    Past Medical History:  Diagnosis Date   Asthma, well controlled 08/29/2019   High cholesterol    MS (multiple sclerosis) (HCC)    Dx 1986    Past Surgical History:  Procedure Laterality Date   COLONOSCOPY  2012   2 benign polyps   MYRINGOTOMY WITH TUBE PLACEMENT Left 09/04/2019    WISDOM TOOTH EXTRACTION  1982    Review of systems negative except as noted in HPI / PMHx or noted below:  Review of Systems  Constitutional: Negative.   HENT: Negative.    Eyes: Negative.   Respiratory: Negative.    Cardiovascular: Negative.   Gastrointestinal: Negative.   Genitourinary: Negative.   Musculoskeletal: Negative.   Skin: Negative.   Neurological: Negative.   Endo/Heme/Allergies: Negative.   Psychiatric/Behavioral: Negative.       Objective:   Vitals:   04/19/23 1515  BP: 130/82  Pulse: 73  Resp: 18  Temp: 99.5 F (37.5 C)  SpO2: 94%   Height: 5' 4.57" (164 cm)  Weight: 165 lb 6.4 oz (75 kg)   Physical Exam Constitutional:      Appearance: She is not diaphoretic.  HENT:     Head: Normocephalic.     Right Ear: Tympanic membrane, ear canal and external ear normal.     Left Ear: Tympanic membrane, ear canal and external ear normal.     Nose: Nose normal. No mucosal edema or rhinorrhea.     Mouth/Throat:     Pharynx: Uvula midline. No oropharyngeal exudate.  Eyes:     Conjunctiva/sclera: Conjunctivae normal.  Neck:     Thyroid: No thyromegaly.     Trachea: Trachea normal. No tracheal tenderness or tracheal deviation.  Cardiovascular:     Rate and Rhythm: Normal rate and regular rhythm.     Heart sounds: Normal heart sounds, S1 normal and S2 normal. No murmur heard. Pulmonary:     Effort: No respiratory distress.     Breath sounds: Normal breath sounds. No stridor. No wheezing or rales.  Lymphadenopathy:     Head:     Right side of head: No tonsillar adenopathy.     Left side of head: No tonsillar adenopathy.     Cervical: No cervical adenopathy.  Skin:    Findings: No erythema or rash.     Nails: There is no clubbing.  Neurological:     Mental Status: She is alert.     Diagnostics:    Spirometry was performed and demonstrated an FEV1 of 1.40 at 58 % of predicted.  Assessment and Plan:   1. Asthma, moderate persistent, well-controlled    2. Perennial allergic rhinitis   3. LPRD (laryngopharyngeal reflux disease)   4. Hypogammaglobulinemia (HCC)   5. Immunosuppression (HCC)    1.  Continue to Treat and prevent inflammation:   A.  Symbicort 160 -2 inhalations twice a day with a spacer  B.  Fluticasone + Azelastine - 1 spray each nostril 1-2 times a day  2.  Continue to Treat and prevent reflux:   A.  Consolidate caffeine and chocolate consumption  B.  Famotidine 40mg  - 1 tablet 1-2 times per day  3.  If needed:   A. Albuterol HFA - 2 inhalations every 4-6 hours  B. Nasal saline  C. Antihistamine  6. Return  to clinic in 6 months or earlier if problem  7. Fall flu vaccine   Aneesa is doing pretty well at this point and she will continue on anti-inflammatory agents for airway and therapy directed against reflux.  She has discontinued her immunoglobulin infusions March 2024 under the direction of her neurologist who feels that her change in rituximab administration may allow her to receive immunosuppressant without the development of clinically significant hypogammaglobulinemia.  We will see what happens as we move forward over the course of the next 6 months.  Laurette Schimke, MD Allergy / Immunology [facility]

## 2023-04-19 NOTE — Progress Notes (Unsigned)
11

## 2023-04-19 NOTE — Patient Instructions (Addendum)
  1.  Continue to Treat and prevent inflammation:   A.  Symbicort 160 -2 inhalations twice a day with a spacer  B.  Fluticasone + Azelastine - 1 spray each nostril 1-2 times a day  2.  Continue to Treat and prevent reflux:   A.  Consolidate caffeine and chocolate consumption  B.  Famotidine 40mg  - 1 tablet 1-2 times per day  3.  If needed:   A. Albuterol HFA - 2 inhalations every 4-6 hours  B. Nasal saline  C. Antihistamine  6. Return to clinic in 6 months or earlier if problem  7. Fall flu vaccine

## 2023-04-20 ENCOUNTER — Encounter: Payer: Self-pay | Admitting: Allergy and Immunology

## 2023-09-09 ENCOUNTER — Other Ambulatory Visit: Payer: Self-pay | Admitting: Orthopaedic Surgery

## 2023-09-09 DIAGNOSIS — M19272 Secondary osteoarthritis, left ankle and foot: Secondary | ICD-10-CM

## 2023-10-11 ENCOUNTER — Ambulatory Visit
Admission: RE | Admit: 2023-10-11 | Discharge: 2023-10-11 | Disposition: A | Discharge status: Home / Self Care | Payer: 59 | Source: Ambulatory Visit | Attending: Orthopaedic Surgery | Admitting: Orthopaedic Surgery

## 2023-10-11 ENCOUNTER — Encounter: Payer: Self-pay | Admitting: Allergy and Immunology

## 2023-10-11 ENCOUNTER — Ambulatory Visit (INDEPENDENT_AMBULATORY_CARE_PROVIDER_SITE_OTHER): Payer: 59 | Admitting: Allergy and Immunology

## 2023-10-11 ENCOUNTER — Other Ambulatory Visit: Payer: Self-pay

## 2023-10-11 VITALS — BP 130/74 | HR 75 | Temp 98.2°F | Resp 16 | Ht 64.0 in | Wt 156.0 lb

## 2023-10-11 DIAGNOSIS — K219 Gastro-esophageal reflux disease without esophagitis: Secondary | ICD-10-CM

## 2023-10-11 DIAGNOSIS — M19272 Secondary osteoarthritis, left ankle and foot: Secondary | ICD-10-CM

## 2023-10-11 DIAGNOSIS — D849 Immunodeficiency, unspecified: Secondary | ICD-10-CM | POA: Diagnosis not present

## 2023-10-11 DIAGNOSIS — D801 Nonfamilial hypogammaglobulinemia: Secondary | ICD-10-CM

## 2023-10-11 DIAGNOSIS — J4540 Moderate persistent asthma, uncomplicated: Secondary | ICD-10-CM | POA: Diagnosis not present

## 2023-10-11 MED ORDER — LEVOCETIRIZINE DIHYDROCHLORIDE 5 MG PO TABS: 5.0000 mg | ORAL_TABLET | Freq: Every day | ORAL | 1 refills | Status: DC | PRN

## 2023-10-11 MED ORDER — FAMOTIDINE 40 MG PO TABS: 40.0000 mg | ORAL_TABLET | Freq: Two times a day (BID) | ORAL | 1 refills | Status: DC

## 2023-10-11 MED ORDER — SPACER/AERO-HOLDING CHAMBERS DEVI: 1.0000 | 1 refills | Status: DC

## 2023-10-11 MED ORDER — SYMBICORT 160-4.5 MCG/ACT IN AERO: 2.0000 | INHALATION_SPRAY | Freq: Two times a day (BID) | RESPIRATORY_TRACT | 1 refills | Status: DC

## 2023-10-11 MED ORDER — AZELASTINE HCL 0.1 % NA SOLN: 1.0000 | Freq: Two times a day (BID) | NASAL | 1 refills | Status: DC

## 2023-10-11 MED ORDER — FLUTICASONE PROPIONATE 50 MCG/ACT NA SUSP: 1.0000 | Freq: Two times a day (BID) | NASAL | 1 refills | Status: DC

## 2023-10-11 NOTE — Patient Instructions (Addendum)
  1.  Continue to Treat and prevent inflammation:   A.  Symbicort 160 - 2 inhalations twice a day with a spacer  B.  Fluticasone + Azelastine - 1 spray each nostril 1-2 times a day  2.  Continue to Treat and prevent reflux:   A.  Consolidate caffeine and chocolate consumption  B.  Famotidine 40mg  - 1 tablet 1-2 times per day  3.  If needed:   A. Symbicort 160 - 2 inhalations every 6 hours (replaces albuterol)  B. Nasal saline  C. Antihistamine  5. Return to clinic in 6 months or earlier if problem

## 2023-10-11 NOTE — Progress Notes (Unsigned)
[place] - [place] - [place] - [place] - [place]   Follow-up Note  Referring Provider: Tally Joe, MD Primary Provider: Tally Joe, MD Date of Office Visit: 10/11/2023  Subjective:   Mary Odom (DOB: Nov 07, 1961) is a 62 y.o. 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  I last saw in this clinic 19 Apr 2023.  As noted during her last visit she has discontinued immunoglobulin infusions since March 2024 as she has had her rituximab use decreased by 50%.  Apparently she did have an infection involving her left parotid gland secondary to a stone that developed June 2024 and she had removal of that stone and removal of the abscess and she is doing quite well at this point in time.  Otherwise, she has not really had any additional infections.  Her asthma is under excellent control.  She rarely uses any short acting bronchodilator while she continues on Symbicort twice a day.  Her upper airway is under excellent control on her current nasal sprays.  She has not required a systemic steroid or an antibiotic for a respiratory tract issue.  Her reflux is under very good control with famotidine.  She is obtain the flu vaccine this year.  Allergies as of 10/11/2023       Reactions   Latex Rash        Medication List    albuterol 108 (90 Base) MCG/ACT inhaler Commonly known as: VENTOLIN HFA Can inhale two puffs every four to six hours as needed for cough or wheeze.   aspirin 81 MG tablet Take 81 mg by mouth daily.   azelastine 0.05 % ophthalmic solution Commonly known as: OPTIVAR   azelastine 0.1 % nasal spray Commonly known as: ASTELIN 1 spray each nostril 1-2 times daily.   cetirizine 10 MG tablet Commonly known as: ZYRTEC Take 10 mg by  mouth daily.   famotidine 40 MG tablet Commonly known as: PEPCID 1 tablet 1-2 times daily.   Fish Oil 1000 MG Caps Take 1 capsule by mouth daily.   fluticasone 50 MCG/ACT nasal spray Commonly known as: FLONASE 1 spray each nostril 1-2 times daily.   gabapentin 300 MG capsule Commonly known as: NEURONTIN Take 300 mg by mouth at bedtime.   Lutein-Zeaxanthin 25-5 MG Caps lutein-zeaxanthin   meloxicam 7.5 MG tablet Commonly known as: MOBIC Take 7.5 mg by mouth daily.   MIRALAX PO Miralax   modafinil 200 MG tablet Commonly known as: PROVIGIL Take 200 mg by mouth daily.   Psyllium 0.36 g Caps Metamucil   pyridOXINE 50 MG tablet Commonly known as: VITAMIN B6 Take 50 mg by mouth daily.   simvastatin 20 MG tablet Commonly known as: ZOCOR Take 20 mg by mouth at bedtime.   Symbicort 160-4.5 MCG/ACT inhaler Generic drug: budesonide-formoterol Inhale 2 puffs into the lungs 2 (two) times daily.   Vitamin D (Ergocalciferol) 1.25 MG (50000 UNIT) Caps capsule Commonly known as: DRISDOL Take 2,000 Units by mouth daily.   Voltaren 1 % Gel Generic drug: diclofenac Sodium Apply 2 g topically 4 (four) times daily.    Past Medical History:  Diagnosis Date   Asthma, well controlled 08/29/2019   High cholesterol    MS (multiple sclerosis) (HCC)    Dx 1986    Past Surgical History:  Procedure Laterality  Date   COLONOSCOPY  2012   2 benign polyps   MYRINGOTOMY WITH TUBE PLACEMENT Left 09/04/2019   WISDOM TOOTH EXTRACTION  1982    Review of systems negative except as noted in HPI / PMHx or noted below:  Review of Systems  Constitutional: Negative.   HENT: Negative.    Eyes: Negative.   Respiratory: Negative.    Cardiovascular: Negative.   Gastrointestinal: Negative.   Genitourinary: Negative.   Musculoskeletal: Negative.   Skin: Negative.   Neurological: Negative.   Endo/Heme/Allergies: Negative.   Psychiatric/Behavioral: Negative.       Objective:    Vitals:   10/11/23 1331  BP: 130/74  Pulse: 75  Resp: 16  Temp: 98.2 F (36.8 C)  SpO2: 96%   Height: 5\' 4"  (162.6 cm)  Weight: 156 lb (70.8 kg)   Physical Exam Constitutional:      Appearance: She is not diaphoretic.  HENT:     Head: Normocephalic.     Right Ear: Tympanic membrane, ear canal and external ear normal.     Left Ear: Tympanic membrane, ear canal and external ear normal.     Nose: Nose normal. No mucosal edema or rhinorrhea.     Mouth/Throat:     Pharynx: Uvula midline. No oropharyngeal exudate.  Eyes:     Conjunctiva/sclera: Conjunctivae normal.  Neck:     Thyroid: No thyromegaly.     Trachea: Trachea normal. No tracheal tenderness or tracheal deviation.  Cardiovascular:     Rate and Rhythm: Normal rate and regular rhythm.     Heart sounds: Normal heart sounds, S1 normal and S2 normal. No murmur heard. Pulmonary:     Effort: No respiratory distress.     Breath sounds: Normal breath sounds. No stridor. No wheezing or rales.  Lymphadenopathy:     Head:     Right side of head: No tonsillar adenopathy.     Left side of head: No tonsillar adenopathy.     Cervical: No cervical adenopathy.  Skin:    Findings: No erythema or rash.     Nails: There is no clubbing.  Neurological:     Mental Status: She is alert.     Diagnostics: Spirometry was performed and demonstrated an FEV1 of 1.55 at 64 % of predicted.   Assessment and Plan:   1. Hypogammaglobulinemia (HCC)   2. Immunosuppression (HCC)   3. Asthma, moderate persistent, well-controlled   4. LPRD (laryngopharyngeal reflux disease)    1.  Continue to Treat and prevent inflammation:   A.  Symbicort 160 - 2 inhalations twice a day with a spacer  B.  Fluticasone + Azelastine - 1 spray each nostril 1-2 times a day  2.  Continue to Treat and prevent reflux:   A.  Consolidate caffeine and chocolate consumption  B.  Famotidine 40mg  - 1 tablet 1-2 times per day  3.  If needed:   A. Symbicort 160 -  2 inhalations every 6 hours (replaces albuterol)  B. Nasal saline  C. Antihistamine  5. Return to clinic in 6 months or earlier if problem  Mary Odom appears to be doing quite well and we are going to continue to have her use a collection of anti-inflammatory agents for airway and therapy directed against reflux.  She can use her Symbicort as a anti-inflammatory rescue medicine.  Assuming she does well with this plan we will see her back in this clinic in 6 months or earlier if there is a problem.  Laurette Schimke,  MD Allergy / Immunology [facility]

## 2023-10-12 ENCOUNTER — Encounter: Payer: Self-pay | Admitting: Allergy and Immunology

## 2023-10-25 ENCOUNTER — Other Ambulatory Visit: Payer: Self-pay

## 2023-10-25 ENCOUNTER — Encounter (HOSPITAL_BASED_OUTPATIENT_CLINIC_OR_DEPARTMENT_OTHER): Payer: Self-pay | Admitting: Orthopaedic Surgery

## 2023-10-31 ENCOUNTER — Encounter (HOSPITAL_BASED_OUTPATIENT_CLINIC_OR_DEPARTMENT_OTHER)
Admission: RE | Admit: 2023-10-31 | Discharge: 2023-10-31 | Disposition: A | Discharge status: Home / Self Care | Payer: 59 | Source: Ambulatory Visit | Attending: Orthopaedic Surgery

## 2023-10-31 DIAGNOSIS — Z01812 Encounter for preprocedural laboratory examination: Secondary | ICD-10-CM | POA: Insufficient documentation

## 2023-10-31 DIAGNOSIS — Z01810 Encounter for preprocedural cardiovascular examination: Secondary | ICD-10-CM | POA: Diagnosis present

## 2023-10-31 LAB — SURGICAL PCR SCREEN
MRSA, PCR: NEGATIVE
Staphylococcus aureus: NEGATIVE

## 2023-10-31 NOTE — H&P (Signed)
ORTHOPAEDIC SURGERY H&P  Subjective:  The patient presents with left valgus end stage ankle osteoarthritis.   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   MS (multiple sclerosis) (HCC)    Dx 1986    Past Surgical History:  Procedure Laterality Date   COLONOSCOPY  2012   2 benign polyps   MYRINGOTOMY WITH TUBE PLACEMENT Left 09/04/2019   WISDOM TOOTH EXTRACTION  1982     (Not in an outpatient encounter)    Allergies  Allergen Reactions   Latex Rash    Social History   Socioeconomic History   Marital status: Married    Spouse name: Not on file   Number of children: Not on file   Years of education: Not on file   Highest education level: Not on file  Occupational History   Not on file  Tobacco Use   Smoking status: Never    Passive exposure: Never   Smokeless tobacco: Never  Vaping Use   Vaping status: Never Used  Substance and Sexual Activity   Alcohol use: Yes    Alcohol/week: 1.0 standard drink of alcohol    Types: 1 Standard drinks or equivalent per week    Comment: Social   Drug use: No   Sexual activity: Not Currently    Birth control/protection: Post-menopausal  Other Topics Concern   Not on file  Social History Narrative   Not on file   Social Determinants of Health   Financial Resource Strain: Not on file  Food Insecurity: Not on file  Transportation Needs: Not on file  Physical Activity: Not on file  Stress: Not on file  Social Connections: Not on file  Intimate Partner Violence: Not on file     History reviewed. No pertinent family history.   Review of Systems Pertinent items are noted in HPI.  Objective: Vital signs in last 24 hours:    10/25/2023    3:31 PM 10/11/2023    1:31 PM 04/19/2023    3:15 PM  Vitals with BMI  Height 5\' 4"  5\' 4"  5' 4.567"  Weight 166 lbs 4 oz  156 lbs 165 lbs 6 oz  BMI 28.52 26.76 27.89  Systolic  130 130  Diastolic  74 82  Pulse  75 73      EXAM: General: Well nourished, well developed. Awake, alert and oriented to time, place, person. Normal mood and affect. No apparent distress. Breathing room air.  Operative Lower Extremity: Alignment - Valgus ankle (rigid) Skin intact Tenderness to palpation - ankle joint 5/5 TA, PT, GS, Per, EHL, FHL Sensation intact to light touch throughout Palpable DP and PT pulses Special testing: None  The contralateral foot/ankle was examined for comparison and noted to be neurovascularly intact with no localized deformity, swelling, or tenderness.  Imaging Review All images taken were independently reviewed by me.  Assessment/Plan: The clinical and radiographic findings were reviewed and discussed at length with the patient.  The patient has left valgus end stage ankle osteoarthritis.  We spoke at length about the natural course of these findings. We discussed nonoperative and operative treatment options in detail.  The risks and benefits were presented and reviewed. The risks due to implant failure/irritation, new/persistent/recurrent infection, stiffness, nerve/vessel/tendon injury, nonunion/malunion of any fracture, wound healing issues, allograft usage, development of arthritis, failure of this  surgery, possibility of external fixation in certain situations, possibility of delayed definitive surgery, need for further surgery, prolonged wound care including further soft tissue coverage procedures, thromboembolic events, anesthesia/medical complications/events perioperatively and beyond, amputation, death among others were discussed. The patient acknowledged the explanation and agreed to proceed with the plan.  Netta Cedars  Orthopaedic Surgery [facility]

## 2023-10-31 NOTE — Discharge Instructions (Signed)

## 2023-11-03 ENCOUNTER — Ambulatory Visit (HOSPITAL_COMMUNITY): Payer: 59

## 2023-11-03 ENCOUNTER — Ambulatory Visit (HOSPITAL_BASED_OUTPATIENT_CLINIC_OR_DEPARTMENT_OTHER)
Admission: RE | Admit: 2023-11-03 | Discharge: 2023-11-03 | Disposition: A | Discharge status: Home / Self Care | Payer: 59 | Source: Ambulatory Visit | Attending: Orthopaedic Surgery | Admitting: Orthopaedic Surgery

## 2023-11-03 ENCOUNTER — Encounter (HOSPITAL_BASED_OUTPATIENT_CLINIC_OR_DEPARTMENT_OTHER)
Admission: RE | Disposition: A | Discharge status: Home / Self Care | Payer: Self-pay | Source: Ambulatory Visit | Attending: Orthopaedic Surgery

## 2023-11-03 ENCOUNTER — Ambulatory Visit (HOSPITAL_BASED_OUTPATIENT_CLINIC_OR_DEPARTMENT_OTHER): Payer: 59 | Admitting: Anesthesiology

## 2023-11-03 ENCOUNTER — Other Ambulatory Visit: Payer: Self-pay

## 2023-11-03 ENCOUNTER — Encounter (HOSPITAL_BASED_OUTPATIENT_CLINIC_OR_DEPARTMENT_OTHER): Payer: Self-pay | Admitting: Orthopaedic Surgery

## 2023-11-03 DIAGNOSIS — K219 Gastro-esophageal reflux disease without esophagitis: Secondary | ICD-10-CM | POA: Diagnosis not present

## 2023-11-03 DIAGNOSIS — J45909 Unspecified asthma, uncomplicated: Secondary | ICD-10-CM | POA: Diagnosis not present

## 2023-11-03 DIAGNOSIS — M6702 Short Achilles tendon (acquired), left ankle: Secondary | ICD-10-CM | POA: Insufficient documentation

## 2023-11-03 DIAGNOSIS — G35 Multiple sclerosis: Secondary | ICD-10-CM | POA: Diagnosis not present

## 2023-11-03 DIAGNOSIS — Z01810 Encounter for preprocedural cardiovascular examination: Secondary | ICD-10-CM

## 2023-11-03 DIAGNOSIS — Z79899 Other long term (current) drug therapy: Secondary | ICD-10-CM | POA: Insufficient documentation

## 2023-11-03 DIAGNOSIS — M19072 Primary osteoarthritis, left ankle and foot: Secondary | ICD-10-CM | POA: Diagnosis present

## 2023-11-03 DIAGNOSIS — M19272 Secondary osteoarthritis, left ankle and foot: Secondary | ICD-10-CM | POA: Diagnosis not present

## 2023-11-03 DIAGNOSIS — Z01818 Encounter for other preprocedural examination: Secondary | ICD-10-CM

## 2023-11-03 HISTORY — PX: GASTROCNEMIUS RECESSION: SHX863

## 2023-11-03 HISTORY — PX: TOTAL ANKLE ARTHROPLASTY: SHX811

## 2023-11-03 HISTORY — DX: Unspecified osteoarthritis, unspecified site: M19.90

## 2023-11-03 HISTORY — DX: Nonfamilial hypogammaglobulinemia: D80.1

## 2023-11-03 HISTORY — DX: Gastro-esophageal reflux disease without esophagitis: K21.9

## 2023-11-03 SURGERY — ARTHROPLASTY, ANKLE, TOTAL
Anesthesia: General | Site: Leg Lower | Laterality: Left

## 2023-11-03 MED ORDER — FENTANYL CITRATE (PF) 100 MCG/2ML IJ SOLN
100.0000 ug | Freq: Once | INTRAMUSCULAR | Status: AC
  Administered 2023-11-03: 100 ug via INTRAVENOUS

## 2023-11-03 MED ORDER — OXYCODONE HCL 5 MG/5ML PO SOLN: 5.0000 mg | Freq: Once | ORAL | Status: DC | PRN

## 2023-11-03 MED ORDER — ACETAMINOPHEN 500 MG PO TABS
ORAL_TABLET | ORAL | Status: AC
  Filled 2023-11-03: qty 2

## 2023-11-03 MED ORDER — DEXAMETHASONE SODIUM PHOSPHATE 10 MG/ML IJ SOLN
INTRAMUSCULAR | Status: AC
  Filled 2023-11-03: qty 1

## 2023-11-03 MED ORDER — OXYCODONE HCL 5 MG PO TABS: 5.0000 mg | ORAL_TABLET | Freq: Once | ORAL | Status: DC | PRN

## 2023-11-03 MED ORDER — HYDROMORPHONE HCL 1 MG/ML IJ SOLN: 0.2500 mg | INTRAMUSCULAR | Status: DC | PRN

## 2023-11-03 MED ORDER — SCOPOLAMINE 1 MG/3DAYS TD PT72
1.0000 | MEDICATED_PATCH | TRANSDERMAL | Status: DC
  Administered 2023-11-03: 1.5 mg via TRANSDERMAL

## 2023-11-03 MED ORDER — 0.9 % SODIUM CHLORIDE (POUR BTL) OPTIME
TOPICAL | Status: DC | PRN
  Administered 2023-11-03: 1000 mL

## 2023-11-03 MED ORDER — FENTANYL CITRATE (PF) 100 MCG/2ML IJ SOLN
INTRAMUSCULAR | Status: AC
  Filled 2023-11-03: qty 2

## 2023-11-03 MED ORDER — FENTANYL CITRATE (PF) 100 MCG/2ML IJ SOLN
INTRAMUSCULAR | Status: DC | PRN
  Administered 2023-11-03: 25 ug via INTRAVENOUS
  Administered 2023-11-03: 50 ug via INTRAVENOUS
  Administered 2023-11-03: 25 ug via INTRAVENOUS

## 2023-11-03 MED ORDER — POVIDONE-IODINE 10 % EX SOLN
CUTANEOUS | Status: DC | PRN
  Administered 2023-11-03: 1 via TOPICAL

## 2023-11-03 MED ORDER — VANCOMYCIN HCL 500 MG IV SOLR
INTRAVENOUS | Status: DC | PRN
  Administered 2023-11-03: 500 mg via TOPICAL

## 2023-11-03 MED ORDER — MIDAZOLAM HCL 2 MG/2ML IJ SOLN: 0.5000 mg | Freq: Once | INTRAMUSCULAR | Status: DC | PRN

## 2023-11-03 MED ORDER — LACTATED RINGERS IV SOLN: INTRAVENOUS | Status: DC

## 2023-11-03 MED ORDER — CHLORHEXIDINE GLUCONATE 4 % EX SOLN
60.0000 mL | Freq: Once | CUTANEOUS | Status: AC
  Administered 2023-11-03: 4 via TOPICAL

## 2023-11-03 MED ORDER — MEPERIDINE HCL 25 MG/ML IJ SOLN: 6.2500 mg | INTRAMUSCULAR | Status: DC | PRN

## 2023-11-03 MED ORDER — LIDOCAINE 2% (20 MG/ML) 5 ML SYRINGE
INTRAMUSCULAR | Status: AC
  Filled 2023-11-03: qty 5

## 2023-11-03 MED ORDER — LIDOCAINE HCL (CARDIAC) PF 100 MG/5ML IV SOSY
PREFILLED_SYRINGE | INTRAVENOUS | Status: DC | PRN
  Administered 2023-11-03: 40 mg via INTRAVENOUS

## 2023-11-03 MED ORDER — BUPIVACAINE LIPOSOME 1.3 % IJ SUSP
INTRAMUSCULAR | Status: DC | PRN
  Administered 2023-11-03: 3 mL via PERINEURAL
  Administered 2023-11-03: 7 mL via PERINEURAL

## 2023-11-03 MED ORDER — CEFAZOLIN SODIUM-DEXTROSE 2-4 GM/100ML-% IV SOLN
2.0000 g | INTRAVENOUS | Status: AC
  Administered 2023-11-03: 2 g via INTRAVENOUS

## 2023-11-03 MED ORDER — BUPIVACAINE-EPINEPHRINE (PF) 0.5% -1:200000 IJ SOLN
INTRAMUSCULAR | Status: DC | PRN
  Administered 2023-11-03: 7 mL via PERINEURAL
  Administered 2023-11-03: 13 mL via PERINEURAL

## 2023-11-03 MED ORDER — CEFAZOLIN SODIUM-DEXTROSE 2-4 GM/100ML-% IV SOLN
INTRAVENOUS | Status: AC
  Filled 2023-11-03: qty 100

## 2023-11-03 MED ORDER — MIDAZOLAM HCL 2 MG/2ML IJ SOLN
2.0000 mg | Freq: Once | INTRAMUSCULAR | Status: AC
  Administered 2023-11-03: 2 mg via INTRAVENOUS

## 2023-11-03 MED ORDER — DEXAMETHASONE SODIUM PHOSPHATE 10 MG/ML IJ SOLN
INTRAMUSCULAR | Status: DC | PRN
  Administered 2023-11-03: 10 mg via INTRAVENOUS

## 2023-11-03 MED ORDER — MIDAZOLAM HCL 2 MG/2ML IJ SOLN
INTRAMUSCULAR | Status: AC
  Filled 2023-11-03: qty 2

## 2023-11-03 MED ORDER — ACETAMINOPHEN 500 MG PO TABS
1000.0000 mg | ORAL_TABLET | Freq: Once | ORAL | Status: AC
  Administered 2023-11-03: 1000 mg via ORAL

## 2023-11-03 MED ORDER — ONDANSETRON HCL 4 MG/2ML IJ SOLN
INTRAMUSCULAR | Status: DC | PRN
  Administered 2023-11-03: 4 mg via INTRAVENOUS

## 2023-11-03 MED ORDER — PROPOFOL 10 MG/ML IV BOLUS
INTRAVENOUS | Status: AC
  Filled 2023-11-03: qty 20

## 2023-11-03 MED ORDER — PROPOFOL 10 MG/ML IV BOLUS
INTRAVENOUS | Status: DC | PRN
  Administered 2023-11-03: 130 mg via INTRAVENOUS

## 2023-11-03 MED ORDER — SCOPOLAMINE 1 MG/3DAYS TD PT72
MEDICATED_PATCH | TRANSDERMAL | Status: AC
  Filled 2023-11-03: qty 1

## 2023-11-03 MED ORDER — ONDANSETRON HCL 4 MG/2ML IJ SOLN
INTRAMUSCULAR | Status: AC
  Filled 2023-11-03: qty 2

## 2023-11-03 SURGICAL SUPPLY — 82 items
BANDAGE ESMARK 6X9 LF (GAUZE/BANDAGES/DRESSINGS) ×2 IMPLANT
BIT DRILL 2.9 CANN QC NONSTRL (BIT) IMPLANT
BLADE RECIPRO TAPERED (BLADE) ×2 IMPLANT
BLADE SAW OSC ANKLE 8X63X1.19 (BLADE) ×2 IMPLANT
BLADE SAW RECIP ANKLE 8X50X1 (PIN) ×2 IMPLANT
BLADE SURG 15 STRL LF DISP TIS (BLADE) ×8 IMPLANT
BNDG COHESIVE 4X5 TAN STRL LF (GAUZE/BANDAGES/DRESSINGS) ×2 IMPLANT
BNDG ELASTIC 4INX 5YD STR LF (GAUZE/BANDAGES/DRESSINGS) IMPLANT
BNDG ELASTIC 6INX 5YD STR LF (GAUZE/BANDAGES/DRESSINGS) IMPLANT
BNDG ELASTIC 6X10 VLCR STRL LF (GAUZE/BANDAGES/DRESSINGS) ×2 IMPLANT
BNDG ESMARK 6X9 LF (GAUZE/BANDAGES/DRESSINGS) ×2
BNDG GAUZE DERMACEA FLUFF 4 (GAUZE/BANDAGES/DRESSINGS) ×2 IMPLANT
BRUSH SCRUB EZ 4% CHG (MISCELLANEOUS) ×2 IMPLANT
CANISTER SUCT 1200ML W/VALVE (MISCELLANEOUS) ×2 IMPLANT
CHLORAPREP W/TINT 26 (MISCELLANEOUS) ×2 IMPLANT
CLIP LOCK ANKLE SZ1 (Ankle) IMPLANT
COVER BACK TABLE 60X90IN (DRAPES) ×4 IMPLANT
COVER MAYO STAND STRL (DRAPES) IMPLANT
CUFF TRNQT CYL 34X4.125X (TOURNIQUET CUFF) ×2 IMPLANT
DRAPE C-ARM 42X72 X-RAY (DRAPES) ×2 IMPLANT
DRAPE C-ARMOR (DRAPES) ×2 IMPLANT
DRAPE EXTREMITY T 121X128X90 (DISPOSABLE) ×2 IMPLANT
DRAPE IMP U-DRAPE 54X76 (DRAPES) ×2 IMPLANT
DRAPE U-SHAPE 47X51 STRL (DRAPES) ×2 IMPLANT
DRESSING MEPILEX FLEX 4X4 (GAUZE/BANDAGES/DRESSINGS) ×2 IMPLANT
DRSG AQUACEL AG ADV 3.5X10 (GAUZE/BANDAGES/DRESSINGS) IMPLANT
DRSG MEPILEX FLEX 4X4 (GAUZE/BANDAGES/DRESSINGS) ×2
DRSG MEPITEL 4X7.2 (GAUZE/BANDAGES/DRESSINGS) ×2 IMPLANT
ELECT REM PT RETURN 9FT ADLT (ELECTROSURGICAL) ×2
ELECTRODE REM PT RTRN 9FT ADLT (ELECTROSURGICAL) ×2 IMPLANT
GAUZE PAD ABD 8X10 STRL (GAUZE/BANDAGES/DRESSINGS) ×10 IMPLANT
GAUZE SPONGE 4X4 12PLY STRL (GAUZE/BANDAGES/DRESSINGS) ×2 IMPLANT
GLOVE BIOGEL PI IND STRL 7.0 (GLOVE) IMPLANT
GLOVE BIOGEL PI IND STRL 8 (GLOVE) ×2 IMPLANT
GLOVE SURG SS PI 7.5 STRL IVOR (GLOVE) ×4 IMPLANT
GLOVE SURG SYN 8.0 (GLOVE) ×2 IMPLANT
GLOVE SURG SYN 8.0 PF PI (GLOVE) IMPLANT
GOWN STRL REUS W/ TWL LRG LVL3 (GOWN DISPOSABLE) ×4 IMPLANT
GOWN STRL REUS W/ TWL XL LVL3 (GOWN DISPOSABLE) IMPLANT
IMPL TALAR ANKLE SZ 1 LT (Ankle) IMPLANT
IMPLANT TALAR ANKLE SZ 1 LT (Ankle) ×2 IMPLANT
INSERT TIB FC ANKLE SZ 1 7 (Ankle) IMPLANT
MARKER SKIN DUAL TIP RULER LAB (MISCELLANEOUS) IMPLANT
NDL HYPO 25X1 1.5 SAFETY (NEEDLE) IMPLANT
NEEDLE HYPO 25X1 1.5 SAFETY (NEEDLE) IMPLANT
NS IRRIG 1000ML POUR BTL (IV SOLUTION) ×2 IMPLANT
PACK BASIN DAY SURGERY FS (CUSTOM PROCEDURE TRAY) ×2 IMPLANT
PAD CAST 4YDX4 CTTN HI CHSV (CAST SUPPLIES) ×2 IMPLANT
PADDING CAST ABS COTTON 4X4 ST (CAST SUPPLIES) IMPLANT
PADDING CAST SYNTHETIC 4X4 STR (CAST SUPPLIES) ×4 IMPLANT
PADDING CAST SYNTHETIC 6X4 NS (CAST SUPPLIES) ×4 IMPLANT
PENCIL SMOKE EVACUATOR (MISCELLANEOUS) ×2 IMPLANT
PIN POUCH TALAR VANTAGE 3.5 (PIN) IMPLANT
PLATE TIB FB ANKLE SZ 1 LT (Ankle) IMPLANT
POUCH SCREW TALAR ANKLE (ORTHOPEDIC DISPOSABLE SUPPLIES) IMPLANT
SAW STRYKER ANKLE 10X75X1.19 (BLADE) IMPLANT
SCREW CORTICAL 3.5MM 50MM (Screw) IMPLANT
SHEET MEDIUM DRAPE 40X70 STRL (DRAPES) ×2 IMPLANT
SLEEVE SCD COMPRESS KNEE MED (STOCKING) ×2 IMPLANT
SPIKE FLUID TRANSFER (MISCELLANEOUS) IMPLANT
SPLINT FIBERGLASS 4X30 (CAST SUPPLIES) IMPLANT
SPLINT PLASTER CAST FAST 5X30 (CAST SUPPLIES) ×40 IMPLANT
SPONGE T-LAP 18X18 ~~LOC~~+RFID (SPONGE) ×2 IMPLANT
STAPLER SKIN PROX WIDE 3.9 (STAPLE) IMPLANT
STOCKINETTE 6 STRL (DRAPES) ×2 IMPLANT
STOCKINETTE ORTHO 6X25 (MISCELLANEOUS) ×2 IMPLANT
STRIP CLOSURE SKIN 1/2X4 (GAUZE/BANDAGES/DRESSINGS) IMPLANT
SUCTION TUBE FRAZIER 10FR DISP (SUCTIONS) IMPLANT
SUT ETHILON 2 0 FS 18 (SUTURE) ×4 IMPLANT
SUT ETHILON 3 0 PS 1 (SUTURE) ×2 IMPLANT
SUT MNCRL AB 3-0 PS2 18 (SUTURE) IMPLANT
SUT PDS 3-0 CT2 (SUTURE)
SUT PDS II 3-0 CT2 27 ABS (SUTURE) IMPLANT
SUT VIC AB 2-0 SH 27XBRD (SUTURE) ×2 IMPLANT
SUT VIC AB 3-0 SH 27X BRD (SUTURE) IMPLANT
SUT VICRYL 0 SH 27 (SUTURE) IMPLANT
SYR BULB IRRIG 60ML STRL (SYRINGE) ×2 IMPLANT
SYR CONTROL 10ML LL (SYRINGE) IMPLANT
TOWEL GREEN STERILE FF (TOWEL DISPOSABLE) ×4 IMPLANT
TUBE CONNECTING 20X1/4 (TUBING) ×2 IMPLANT
UNDERPAD 30X36 HEAVY ABSORB (UNDERPADS AND DIAPERS) ×2 IMPLANT
YANKAUER SUCT BULB TIP NO VENT (SUCTIONS) ×2 IMPLANT

## 2023-11-03 NOTE — Op Note (Signed)
11/03/2023  11:33 AM  PATIENT:  Mary Odom  62 y.o. female  PRE-OPERATIVE DIAGNOSIS:  left ankle arthritis  POST-OPERATIVE DIAGNOSIS:  same  Procedure(s):  Left total ankle replacement Left percutaneous achilles tendon lengthening  SURGEON:  Netta Cedars, MD  CO-Surgeon: Toni Arthurs, MD  ANESTHESIA:   General, regional  EBL:  minimal   TOURNIQUET:   Total Tourniquet Time Documented: Thigh (Left) - 121 minutes Total: Thigh (Left) - 121 minutes  COMPLICATIONS:  None apparent  DISPOSITION:  Extubated, awake and stable to recovery.  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  She has failed nonoperative treatment to date and presents today for left total ankle replacement and possible heel cord lengthening. The risks and benefits of the alternative treatment options have been discussed in detail.  The patient wishes to proceed with surgery and specifically understands risks of bleeding, infection, nerve damage, blood clots, need for additional surgery, amputation and death.   PROCEDURE IN DETAIL: Please see the note dictated the same date by Dr. Odis Hollingshead for details of the surgical procedure.  I was present and scrubbed for the duration of the case and served as co-surgeon for decision making, implant placement and alignment, assistance in performing the critical portions of the case, exposure and closure of the wounds.

## 2023-11-03 NOTE — Anesthesia Procedure Notes (Signed)
Anesthesia Regional Block: Adductor canal block   Pre-Anesthetic Checklist: , timeout performed,  Correct Patient, Correct Site, Correct Laterality,  Correct Procedure, Correct Position, site marked,  Risks and benefits discussed,  Surgical consent,  Pre-op evaluation,  At surgeon's request and post-op pain management  Laterality: Left and Lower  Prep: chloraprep       Needles:  Injection technique: Single-shot  Needle Type: Echogenic Needle     Needle Length: 9cm  Needle Gauge: 21     Additional Needles:   Procedures:,,,, ultrasound used (permanent image in chart),,    Narrative:  Start time: 11/03/2023 8:16 AM End time: 11/03/2023 8:23 AM Injection made incrementally with aspirations every 5 mL.  Performed by: Personally  Anesthesiologist: Jairo Ben, MD  Additional Notes: Pt identified in Holding room.  Monitors applied. Working IV access confirmed. Timeout, Sterile prep L thigh.  #21ga ECHOgenic Arrow block needle into adductor canal with US guidance.  7cc 0.5% Bupivacaine 1:200k epi, Exparel injected incrementally after negative test dose.  Patient asymptomatic, VSS, no heme aspirated, tolerated well.   Sandford Craze, MD

## 2023-11-03 NOTE — H&P (Signed)
H&P Update:  -History and Physical Reviewed  -Patient has been re-examined  -No change in the plan of care  -The risks and benefits were presented and reviewed. The risks due to hardware/suture failure and/or irritation, new/persistent infection, stiffness, nerve/vessel/tendon injury or rerupture of repaired tendon, nonunion/malunion, allograft usage, wound healing issues, development of adjacent joint arthritis, failure of this surgery, possibility of external fixation with delayed definitive surgery, need for further surgery, thromboembolic events, anesthesia/medical complications, amputation, death among others were discussed. The patient acknowledged the explanation, agreed to proceed with the plan and a consent was signed.  Mary Odom

## 2023-11-03 NOTE — Op Note (Signed)
11/03/2023  11:33 AM   PATIENT: Mary Odom  62 y.o. 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   INDICATION FOR PROCEDURE: The patient presented with above diagnosis. PMH notable for multiple sclerosis. Preoperative clearance obtained by Neurology with their assistance in perioperative management of her infusions for MS. She failed extensive nonoperative care including prescription medication, activity modification, multiple injections and physical therapy.   We discussed the diagnosis, alternative treatment options, risks and benefits of the above surgical intervention, as well as alternative non-operative treatments. All questions/concerns were addressed and the patient/family demonstrated appropriate understanding of the diagnosis, the procedure, the postoperative course, and overall prognosis. The patient wished to proceed with surgical intervention and signed an informed surgical consent as such, in each others presence prior to surgery.   PROCEDURE IN DETAIL: After preoperative consent was obtained and the correct operative site was identified, the patient was brought to the operating room supine on stretcher and transferred onto operating table. General anesthesia was induced. Preoperative antibiotics were administered. Surgical timeout was  taken. The patient was then positioned supine with an ipsilateral hip bump. The operative lower extremity was prepped and draped in standard sterile fashion with a tourniquet around the thigh. The extremity was exsanguinated and the tourniquet was inflated to 275 mmHg.  A standard midline incision was then made over the anterior ankle.  Dissection was carried sharply down through the subcutaneous tissues.  The extensor retinaculum was released over the EHL.  The interval then between the EHL and the tibialis anterior was developed.  The neurovascular bundle was identified.  It was mobilized and retracted laterally.  It was protected throughout the case.  The anterior joint capsule was incised and elevated medially and laterally.  Anterior osteophytes were removed with a curved osteotome.  The tibial alignment guide was inserted in line with the tibial shaft and pinned proximally.  It was positioned at the medial gutter and provisionally pinned distally.  It was aligned on the lateral projection at the level of the tibial plafond.  The slope was set.  The distal block was pinned.  Medial lateral translation was then set in the AP plane.  The distal cutting guide was pinned securely.  The distal cut was made with reciprocating and oscillating saws.  The bone was then removed.  A talar cutting block was then positioned appropriately.  A lateral view was obtained before pinning the block.  The talar dome cut was made with the oscillating saw.  The talar sizing guide was inserted.  A size 1 lollipop was pinned into position.  The lateral radiograph confirmed appropriate position of the guide.  The guide was then removed after pinning.  The cutting block was positioned over the pins.  It was secured with 2 additional pins.  The posterior cut was made with the oscillating saw followed by the 2 anterior mill slots.  The central pins were removed and the final posterior cut was made.  The cutting block was removed followed  by all waste bone.  Anterior osteophytes were removed with a rondure.  The curved rasp was used to smooth the cuts.  The wound was irrigated copiously.  A talar trial implant was inserted and provisionally fixed with a screw.  It was noted to fit appropriately on the lateral radiograph.  A size 2 tibial trial was noted to be oversized.  A size 1 was inserted successfully and covered the distal tibial cut appropriately.  The trial was pinned.  The central lug hole was punched followed by the 3 peripheral lug holes.  A trial poly was inserted.  Rotation at the talar implant was appropriate.  The 2 talar lug holes were drilled.  The trial implants were removed.  The wound was irrigated copiously.  Attention was then returned to the distal tibial cut.  The tibial implant was sprinkled with vancomycin powder and inserted successfully.  It was impacted into position.  The talar implant was then inserted while protecting the tibial implant.  It was impacted into position.  A trial poly was inserted.    We then turned to the medial malleolus due to her small bony anatomy. Under fluoroscopic guidance, a Kirschner wire was placed to prophylactically protect her narrow medial malleolus and to serve as guide for a solid screw. We then made an incision around the wire and overdrilled this with a cannulated drill. We then removed the wire and placed a 4.0 mm [employer] fully threaded solid screw. We verified position of the screw within medial malleolus in all planes with fluoroscopy.  The heel cord was noted to be tight.  It was lengthened using a triple hemisection percutaneous technique.  The ankle dorsiflex 30 degrees with the knee extended.    The trial poly was removed.  An 7 mm vitamin E poly spacer was inserted.  A locking clip was inserted and seated appropriately.    Final AP, mortise and lateral radiographs confirmed appropriate position of all hardware.  The wound was irrigated copiously and sprinkled with  vancomycin powder.  The anterior joint capsule was repaired with Vicryl.  The retinaculum was repaired with Vicryl.  Subcutaneous tissues were approximated with Monocryl.  2-0 Nylon suture was used to close the skin incisions.  Sterile dressings were applied followed by a well-padded short leg cast.  Tourniquet was released after application of the dressings.  The patient was awakened from anesthesia and transported to the recovery room in stable condition.   FOLLOW UP PLAN: -transfer to PACU, then home -strict NWB operative extremity, maximum elevation -maintain short leg splint until follow up -DVT ppx: Aspirin 81 mg twice daily while NWB  -follow up as outpatient within 7-10 days for wound check with exchange of short leg splint to short leg cast -sutures out in 2-3 weeks in outpatient office   RADIOGRAPHS: AP, lateral, oblique and stress radiographs of the left ankle were obtained intraoperatively. These showed interval total ankle arthroplasty with prophylactic fixation of the medial malleolus. Manual stress radiographs were taken and the joints were noted to be stable following implantation. All hardware is appropriately positioned and of the appropriate lengths. No other acute injuries are noted.   Netta Cedars [facility]

## 2023-11-03 NOTE — Anesthesia Procedure Notes (Signed)
Anesthesia Regional Block: Popliteal block   Pre-Anesthetic Checklist: , timeout performed,  Correct Patient, Correct Site, Correct Laterality,  Correct Procedure, Correct Position, site marked,  Risks and benefits discussed,  Surgical consent,  Pre-op evaluation,  At surgeon's request and post-op pain management  Laterality: Left and Lower  Prep: chloraprep       Needles:  Injection technique: Single-shot  Needle Type: Echogenic Needle     Needle Length: 9cm  Needle Gauge: 21     Additional Needles:   Procedures:,,,, ultrasound used (permanent image in chart),,    Narrative:  Start time: 11/03/2023 8:24 AM End time: 11/03/2023 8:28 AM Injection made incrementally with aspirations every 5 mL.  Performed by: Personally  Anesthesiologist: Jairo Ben, MD  Additional Notes: Pt identified in Holding room.  Monitors applied. Working IV access confirmed. Timeout, Sterile prep L distal, lateral thigh/knee.  #21ga ECHOgenic Arrow block needle sciatic nerve at split in popliteal fossa with US guidance.  13cc 0.5% Bupivacaine 1:200k epi, Exparel injected incrementally after negative test dose.  Patient asymptomatic, VSS, no heme aspirated, tolerated well.   Sandford Craze, MD

## 2023-11-03 NOTE — Anesthesia Preprocedure Evaluation (Addendum)
Anesthesia Evaluation  Patient identified by MRN, date of birth, ID band Patient awake    Reviewed: Allergy & Precautions, NPO status , Patient's Chart, lab work & pertinent test results  History of Anesthesia Complications Negative for: history of anesthetic complications  Airway Mallampati: II  TM Distance: >3 FB Neck ROM: Full    Dental  (+) Dental Advisory Given   Pulmonary asthma    breath sounds clear to auscultation       Cardiovascular negative cardio ROS  Rhythm:Regular Rate:Normal     Neuro/Psych MS: gait instability    GI/Hepatic Neg liver ROS,GERD  Medicated and Controlled,,  Endo/Other  negative endocrine ROS    Renal/GU negative Renal ROS     Musculoskeletal   Abdominal   Peds  Hematology negative hematology ROS (+)   Anesthesia Other Findings   Reproductive/Obstetrics                             Anesthesia Physical Anesthesia Plan  ASA: 3  Anesthesia Plan: General   Post-op Pain Management: Regional block* and Tylenol PO (pre-op)*   Induction: Intravenous  PONV Risk Score and Plan: 3 and Ondansetron, Dexamethasone and Scopolamine patch - Pre-op  Airway Management Planned: LMA  Additional Equipment: None  Intra-op Plan:   Post-operative Plan:   Informed Consent: I have reviewed the patients History and Physical, chart, labs and discussed the procedure including the risks, benefits and alternatives for the proposed anesthesia with the patient or authorized representative who has indicated his/her understanding and acceptance.     Dental advisory given  Plan Discussed with: CRNA and Surgeon  Anesthesia Plan Comments: (Plan routine monitors, GA with adductor canal and popliteal blocks for post op analgesia)        Anesthesia Quick Evaluation

## 2023-11-03 NOTE — Anesthesia Postprocedure Evaluation (Signed)
Anesthesia Post Note  Patient: Mary Odom  Procedure(s) Performed: TOTAL ANKLE ARTHROPLASTY (Left: Ankle) GASTROCNEMIUS SLIDE VERSUS ACHILLES TENDON LENGTHENING (Left: Leg Lower)     Patient location during evaluation: PACU Anesthesia Type: General Level of consciousness: awake and alert, patient cooperative and oriented Pain management: pain level controlled Vital Signs Assessment: post-procedure vital signs reviewed and stable Respiratory status: spontaneous breathing, nonlabored ventilation and respiratory function stable Cardiovascular status: blood pressure returned to baseline and stable Postop Assessment: no apparent nausea or vomiting Anesthetic complications: no   No notable events documented.  Last Vitals:  Vitals:   11/03/23 1115 11/03/23 1130  BP: 126/70 122/71  Pulse: 67 68  Resp: 15 13  Temp:    SpO2: 97% 97%    Last Pain:  Vitals:   11/03/23 1130  TempSrc:   PainSc: 0-No pain    LLE Motor Response: Non-purposeful movement (11/03/23 1130)            Lashaya Kienitz,E. Tawny Raspberry

## 2023-11-03 NOTE — Transfer of Care (Signed)
Immediate Anesthesia Transfer of Care Note  Patient: Mary Odom  Procedure(s) Performed: TOTAL ANKLE ARTHROPLASTY (Left: Ankle) GASTROCNEMIUS SLIDE VERSUS ACHILLES TENDON LENGTHENING (Left: Leg Lower)  Patient Location: PACU  Anesthesia Type:General and Regional  Level of Consciousness: awake, alert , and patient cooperative  Airway & Oxygen Therapy: Patient Spontanous Breathing and Patient connected to face mask oxygen  Post-op Assessment: Report given to RN and Post -op Vital signs reviewed and stable  Post vital signs: Reviewed and stable  Last Vitals:  Vitals Value Taken Time  BP    Temp    Pulse    Resp    SpO2      Last Pain:  Vitals:   11/03/23 0753  TempSrc: Oral  PainSc: 0-No pain      Patients Stated Pain Goal: 4 (11/03/23 0753)  Complications: No notable events documented.

## 2023-11-03 NOTE — Anesthesia Procedure Notes (Signed)
Procedure Name: LMA Insertion Date/Time: 11/03/2023 8:46 AM  Performed by: Thornell Mule, CRNAPre-anesthesia Checklist: Patient identified, Emergency Drugs available, Suction available and Patient being monitored Patient Re-evaluated:Patient Re-evaluated prior to induction Oxygen Delivery Method: Circle system utilized Preoxygenation: Pre-oxygenation with 100% oxygen Induction Type: IV induction Ventilation: Mask ventilation without difficulty LMA: LMA inserted LMA Size: 4.0 Number of attempts: 1 Placement Confirmation: positive ETCO2 Tube secured with: Tape Dental Injury: Teeth and Oropharynx as per pre-operative assessment

## 2023-11-03 NOTE — Progress Notes (Signed)
Assisted Dr. Jairo Ben with left, adductor canal, pectoralis, ultrasound guided block. Side rails up, monitors on throughout procedure. See vital signs in flow sheet. Tolerated Procedure well.

## 2023-11-04 ENCOUNTER — Encounter (HOSPITAL_BASED_OUTPATIENT_CLINIC_OR_DEPARTMENT_OTHER): Payer: Self-pay | Admitting: Orthopaedic Surgery

## 2024-04-10 ENCOUNTER — Ambulatory Visit (INDEPENDENT_AMBULATORY_CARE_PROVIDER_SITE_OTHER): Payer: 59 | Admitting: Allergy and Immunology

## 2024-04-10 VITALS — BP 112/86 | HR 65 | Temp 98.6°F | Resp 14 | Ht 63.19 in | Wt 162.5 lb

## 2024-04-10 DIAGNOSIS — D801 Nonfamilial hypogammaglobulinemia: Secondary | ICD-10-CM

## 2024-04-10 DIAGNOSIS — J3089 Other allergic rhinitis: Secondary | ICD-10-CM | POA: Diagnosis not present

## 2024-04-10 DIAGNOSIS — J4540 Moderate persistent asthma, uncomplicated: Secondary | ICD-10-CM

## 2024-04-10 DIAGNOSIS — K219 Gastro-esophageal reflux disease without esophagitis: Secondary | ICD-10-CM | POA: Diagnosis not present

## 2024-04-10 DIAGNOSIS — R430 Anosmia: Secondary | ICD-10-CM

## 2024-04-10 MED ORDER — FAMOTIDINE 40 MG PO TABS: 40.0000 mg | ORAL_TABLET | Freq: Two times a day (BID) | ORAL | 1 refills | Status: DC

## 2024-04-10 MED ORDER — PREDNISONE 10 MG PO TABS: 10.0000 mg | ORAL_TABLET | Freq: Every morning | ORAL | 0 refills | Status: AC

## 2024-04-10 MED ORDER — SPACER/AERO-HOLDING CHAMBERS DEVI: 1.0000 | 1 refills | Status: AC

## 2024-04-10 MED ORDER — FLUTICASONE PROPIONATE 50 MCG/ACT NA SUSP: 1.0000 | Freq: Two times a day (BID) | NASAL | 1 refills | Status: DC

## 2024-04-10 MED ORDER — AZELASTINE HCL 0.1 % NA SOLN: 1.0000 | Freq: Two times a day (BID) | NASAL | 1 refills | Status: DC

## 2024-04-10 MED ORDER — CETIRIZINE HCL 10 MG PO TABS: 10.0000 mg | ORAL_TABLET | Freq: Every day | ORAL | 1 refills | Status: AC | PRN

## 2024-04-10 MED ORDER — SYMBICORT 160-4.5 MCG/ACT IN AERO: 2.0000 | INHALATION_SPRAY | Freq: Two times a day (BID) | RESPIRATORY_TRACT | 1 refills | Status: DC

## 2024-04-10 NOTE — Patient Instructions (Addendum)
  1.  Continue to Treat and prevent inflammation:   A.  Symbicort  160 - 2 inhalations twice a day with a spacer  B.  Fluticasone  + Azelastine  - 1 spray each nostril 2 times a day  C.  Prednisone  10 mg - 1 tablet 1 time per day for 10 days: Improved smell??  2.  Continue to Treat and prevent reflux:   A.  Consolidate caffeine and chocolate consumption  B.  Famotidine  40mg  - 1 tablet 1-2 times per day  3.  If needed:   A. Symbicort  160 - 2 inhalations every 6 hours (replaces albuterol )  B. Nasal saline  C. Antihistamine  4. Return to clinic in 6 months or earlier if problem  5. Influenza = Tamiflu. Covid = Paxlovid

## 2024-04-10 NOTE — Progress Notes (Unsigned)
 [place] - [place] - [place] - [place] - [place]   Follow-up Note  Referring Provider: Rae Bugler, MD Primary Provider: Rae Bugler, MD Date of Office Visit: 04/10/2024  Subjective:   Mary Odom (DOB: [birth year]) is a 63 y.o. 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  I last saw her in this clinic 11 October 2023.  She has really done very well with her airway and has not had any infections and has not required a systemic steroid or antibiotic for any type of airway issue.  She can exert herself with no problem and rarely uses the short acting bronchodilator while she continues on Symbicort  on a consistent basis.  The only issue that is developed since December 2024 is the fact that she really cannot smell very well.  This occurs even though she has been using nasal steroids and nasal antihistamine.  She continues on rituximab  for her MS.  She is not using any immunoglobulin infusions at this point in time.  Apparently her last rituximab  administration was November 2024.  This may have been her last dose because her immunoglobulin levels were low but is not entirely clear why she is not getting rituximab  at this point.  It is her neurologist that is managing her immunoglobulin level monitoring.  She has had very little problems with her reflux.  Allergies as of 04/10/2024       Reactions   Latex Rash        Medication List    aspirin 81 MG tablet Take 81 mg by mouth daily.   azelastine  0.05 % ophthalmic solution Commonly known as: OPTIVAR    azelastine  0.1 % nasal spray Commonly known as: ASTELIN  Place 1 spray into both nostrils 2 (two) times daily. 1 spray each nostril 1-2 times daily.   cetirizine 10 MG tablet Commonly known as:  ZYRTEC Take 10 mg by mouth daily.   famotidine  40 MG tablet Commonly known as: PEPCID  Take 1 tablet (40 mg total) by mouth 2 (two) times daily. 1 tablet 1-2 times daily.   Fish Oil 1000 MG Caps Take 1 capsule by mouth daily.   fluticasone  50 MCG/ACT nasal spray Commonly known as: FLONASE  Place 1 spray into both nostrils 2 (two) times daily. 1 spray each nostril 1-2 times daily.   gabapentin 300 MG capsule Commonly known as: NEURONTIN Take 300 mg by mouth at bedtime.   Lutein-Zeaxanthin 25-5 MG Caps lutein-zeaxanthin   meloxicam 7.5 MG tablet Commonly known as: MOBIC Take 7.5 mg by mouth daily.   modafinil 200 MG tablet Commonly known as: PROVIGIL Take 200 mg by mouth daily.   PRESERVISION AREDS 2 PO Take by mouth.   Psyllium 0.36 g Caps Metamucil   pyridOXINE 50 MG tablet Commonly known as: VITAMIN B6 Take 50 mg by mouth daily.   RITUXAN  IV Inject into the vein.   simvastatin 20 MG tablet Commonly known as: ZOCOR Take 20 mg by mouth at bedtime.   Spacer/Aero-Holding [employer] 1 Device by Does not apply route as directed.   Symbicort  160-4.5 MCG/ACT inhaler Generic drug: budesonide -formoterol  Inhale 2 puffs into the lungs 2 (two) times daily.   Vitamin D (Ergocalciferol) 1.25 MG (50000 UNIT) Caps capsule Commonly known as: DRISDOL Take 2,000 Units by mouth daily.   Voltaren 1 % Gel Generic  drug: diclofenac Sodium Apply 2 g topically 4 (four) times daily.    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    MS (multiple sclerosis) (HCC)    Dx 1986    Past Surgical History:  Procedure Laterality Date   COLONOSCOPY  2012   2 benign polyps   GASTROCNEMIUS RECESSION Left 11/03/2023   Procedure: GASTROCNEMIUS SLIDE VERSUS ACHILLES TENDON LENGTHENING;  Surgeon: Ali Ink, MD;  Location:  [facility];  Service: Orthopedics;  Laterality: Left;   MYRINGOTOMY WITH TUBE PLACEMENT Left 09/04/2019   TOTAL ANKLE ARTHROPLASTY Left 11/03/2023   Procedure: TOTAL ANKLE ARTHROPLASTY;  Surgeon: Ali Ink, MD;  Location: [facility];  Service: Orthopedics;  Laterality: Left;   WISDOM TOOTH EXTRACTION  1982    Review of systems negative except as noted in HPI / PMHx or noted below:  Review of Systems  Constitutional: Negative.   HENT: Negative.    Eyes: Negative.   Respiratory: Negative.    Cardiovascular: Negative.   Gastrointestinal: Negative.   Genitourinary: Negative.   Musculoskeletal: Negative.   Skin: Negative.   Neurological: Negative.   Endo/Heme/Allergies: Negative.   Psychiatric/Behavioral: Negative.       Objective:   Vitals:   04/10/24 1343  BP: 112/86  Pulse: 65  Resp: 14  Temp: 98.6 F (37 C)  SpO2: 97%   Height: 5' 3.19" (160.5 cm)  Weight: 162 lb 8 oz (73.7 kg)   Physical Exam Constitutional:      Appearance: She is not diaphoretic.  HENT:     Head: Normocephalic.     Right Ear: Tympanic membrane, ear canal and external ear normal.     Left Ear: Tympanic membrane, ear canal and external ear normal.     Nose: Nose normal. No mucosal edema or rhinorrhea.     Mouth/Throat:     Pharynx: Uvula midline. No oropharyngeal exudate.  Eyes:     Conjunctiva/sclera: Conjunctivae normal.  Neck:     Thyroid: No thyromegaly.     Trachea: Trachea normal. No tracheal tenderness or tracheal deviation.  Cardiovascular:     Rate and Rhythm: Normal rate and regular rhythm.     Heart sounds: Normal heart sounds, S1 normal and S2 normal. No murmur heard. Pulmonary:     Effort: No respiratory distress.     Breath sounds: Normal breath sounds. No stridor. No wheezing or rales.  Lymphadenopathy:     Head:     Right side of head: No tonsillar adenopathy.     Left side of head: No tonsillar adenopathy.     Cervical: No cervical  adenopathy.  Skin:    Findings: No erythema or rash.     Nails: There is no clubbing.  Neurological:     Mental Status: She is alert.     Diagnostics: Spirometry was performed and demonstrated an FEV1 of 1.49 at 62 % of predicted.  Assessment and Plan:   1. Asthma, moderate persistent, well-controlled   2. LPRD (laryngopharyngeal reflux disease)   3. Hypogammaglobulinemia (HCC)   4. Perennial allergic rhinitis   5. Anosmia    1.  Continue to Treat and prevent inflammation:   A.  Symbicort  160 - 2 inhalations twice a day with a spacer  B.  Fluticasone  + Azelastine  - 1 spray each nostril 2  times a day  C.  Prednisone  10 mg - 1 tablet 1 time per day for 10 days: Improved smell??  2.  Continue to Treat and prevent reflux:   A.  Consolidate caffeine and chocolate consumption  B.  Famotidine  40mg  - 1 tablet 1-2 times per day  3.  If needed:   A. Symbicort  160 - 2 inhalations every 6 hours (replaces albuterol )  B. Nasal saline  C. Antihistamine  4. Return to clinic in 6 months or earlier if problem  5. Influenza = Tamiflu. Covid = Paxlovid    Schuyler Custard, MD Allergy  / Immunology [facility]

## 2024-04-11 ENCOUNTER — Encounter: Payer: Self-pay | Admitting: Allergy and Immunology

## 2024-04-13 ENCOUNTER — Encounter: Payer: Self-pay | Admitting: Allergy and Immunology

## 2024-04-16 NOTE — Telephone Encounter (Signed)
 Prednisone  can definitely interfere with sleep.  I would make sure you are taking the dose first thing in the morning.  If it is still bothering your sleep and smell hasn't improved I would stop the medication.

## 2024-05-17 ENCOUNTER — Encounter: Payer: Self-pay | Admitting: Allergy and Immunology

## 2024-08-07 ENCOUNTER — Ambulatory Visit (INDEPENDENT_AMBULATORY_CARE_PROVIDER_SITE_OTHER): Admitting: Allergy and Immunology

## 2024-08-07 ENCOUNTER — Telehealth: Payer: Self-pay

## 2024-08-07 ENCOUNTER — Encounter: Payer: Self-pay | Admitting: Allergy and Immunology

## 2024-08-07 VITALS — BP 118/70 | HR 65 | Temp 98.3°F | Resp 16 | Ht 64.0 in | Wt 166.2 lb

## 2024-08-07 DIAGNOSIS — D801 Nonfamilial hypogammaglobulinemia: Secondary | ICD-10-CM | POA: Diagnosis not present

## 2024-08-07 DIAGNOSIS — J4540 Moderate persistent asthma, uncomplicated: Secondary | ICD-10-CM | POA: Diagnosis not present

## 2024-08-07 DIAGNOSIS — D849 Immunodeficiency, unspecified: Secondary | ICD-10-CM

## 2024-08-07 DIAGNOSIS — R430 Anosmia: Secondary | ICD-10-CM

## 2024-08-07 DIAGNOSIS — J324 Chronic pansinusitis: Secondary | ICD-10-CM | POA: Diagnosis not present

## 2024-08-07 DIAGNOSIS — K219 Gastro-esophageal reflux disease without esophagitis: Secondary | ICD-10-CM

## 2024-08-07 MED ORDER — AZELASTINE HCL 0.1 % NA SOLN: 1.0000 | Freq: Two times a day (BID) | NASAL | 1 refills | Status: AC

## 2024-08-07 MED ORDER — FLUTICASONE PROPIONATE 50 MCG/ACT NA SUSP: 1.0000 | Freq: Two times a day (BID) | NASAL | 1 refills | Status: AC

## 2024-08-07 MED ORDER — FAMOTIDINE 40 MG PO TABS: 40.0000 mg | ORAL_TABLET | Freq: Two times a day (BID) | ORAL | 1 refills | Status: AC

## 2024-08-07 MED ORDER — SYMBICORT 160-4.5 MCG/ACT IN AERO: 2.0000 | INHALATION_SPRAY | Freq: Two times a day (BID) | RESPIRATORY_TRACT | 1 refills | Status: AC

## 2024-08-07 MED ORDER — LEVOCETIRIZINE DIHYDROCHLORIDE 5 MG PO TABS: 5.0000 mg | ORAL_TABLET | Freq: Every day | ORAL | 1 refills | Status: AC | PRN

## 2024-08-07 NOTE — Telephone Encounter (Signed)
 Per Provider:  Please let Mary Odom know that insurance will not pay for a MRI in evaluation of chronic sinusitis.  Called patient back - advised of above provider notation.  Patient verbalized understanding - will call [facility] tomorrow and schedule her CT Sinus w/o contrast.

## 2024-08-07 NOTE — Progress Notes (Unsigned)
 [place] - [place] - [place] - [place] - [place]   Follow-up Note  Referring Provider: Seabron Lenis, MD Primary Provider: Seabron Lenis, MD Date of Office Visit: 08/07/2024  Subjective:   Mary Odom (DOB: 30-May-1961) is a 63 y.o. 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  I last saw her in this clinic 10 Apr 2024.  During her last visit we gave her some prednisone  to see if this would help with her anosmia and indeed while utilizing prednisone  it did appear to help with this issue but now that her prednisone  use is over she can not smell once again.  And she has lots of nasal discharge that is predominantly clear but occasionally little bit yellow.  She has been using her nasal sprays on a consistent basis.  She has no problems with her asthma while utilizing Symbicort  on a consistent basis.  Rarely does she use a short acting bronchodilator.  She has not been exercising to any significant degree.  Her reflux is under very good control at this point in time.  She continues to use her famotidine  just 1 time per day.  Her last rituximab  infusion was February 2025.  She had a check of her immunoglobulins yesterday performed by her neurologist and based upon those levels they will make a decision about administration of rituximab .  Allergies as of 08/07/2024       Reactions   Latex Rash        Medication List    aspirin 81 MG tablet Take 81 mg by mouth daily.   azelastine  0.05 % ophthalmic solution Commonly known as: OPTIVAR    azelastine  0.1 % nasal spray Commonly known as: ASTELIN  Place 1 spray into both nostrils 2 (two) times daily. 1 spray each nostril 1-2 times daily.   cetirizine  10 MG tablet Commonly known as: ZYRTEC  Take 1 tablet (10 mg total)  by mouth daily as needed for allergies (Can take an extra dose during flare ups.).   famotidine  40 MG tablet Commonly known as: PEPCID  Take 1 tablet (40 mg total) by mouth 2 (two) times daily.   Fish Oil 1000 MG Caps Take 1 capsule by mouth daily.   fluticasone  50 MCG/ACT nasal spray Commonly known as: FLONASE  Place 1 spray into both nostrils 2 (two) times daily.   gabapentin 300 MG capsule Commonly known as: NEURONTIN Take 300 mg by mouth at bedtime.   Lutein-Zeaxanthin 25-5 MG Caps lutein-zeaxanthin   meloxicam 7.5 MG tablet Commonly known as: MOBIC Take 7.5 mg by mouth daily.   modafinil 200 MG tablet Commonly known as: PROVIGIL Take 200 mg by mouth daily.   PRESERVISION AREDS 2 PO Take by mouth.   Psyllium 0.36 g Caps Metamucil   pyridOXINE 50 MG tablet Commonly known as: VITAMIN B6 Take 50 mg by mouth daily.   RITUXAN  IV Inject into the vein. What changed: additional instructions   simvastatin 20 MG tablet Commonly known as: ZOCOR Take 20 mg by mouth at bedtime.   Spacer/Aero-Holding [employer] 1 Device by Does not apply route as directed.   Symbicort  160-4.5 MCG/ACT inhaler Generic drug: budesonide -formoterol  Inhale 2 puffs into the lungs 2 (two) times daily.   Vitamin D (Ergocalciferol) 1.25 MG (50000 UNIT) Caps capsule Commonly known as: DRISDOL Take 2,000 Units by mouth daily.  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    MS (multiple sclerosis) (HCC)    Dx 1986    Past Surgical History:  Procedure Laterality Date   COLONOSCOPY  2012   2 benign polyps   GASTROCNEMIUS RECESSION Left 11/03/2023   Procedure: GASTROCNEMIUS SLIDE VERSUS ACHILLES TENDON LENGTHENING;  Surgeon: Barton Drape, MD;  Location: [facility];  Service: Orthopedics;  Laterality: Left;    MYRINGOTOMY WITH TUBE PLACEMENT Left 09/04/2019   TOTAL ANKLE ARTHROPLASTY Left 11/03/2023   Procedure: TOTAL ANKLE ARTHROPLASTY;  Surgeon: Barton Drape, MD;  Location: [facility];  Service: Orthopedics;  Laterality: Left;   WISDOM TOOTH EXTRACTION  1982    Review of systems negative except as noted in HPI / PMHx or noted below:  Review of Systems  Constitutional: Negative.   HENT: Negative.    Eyes: Negative.   Respiratory: Negative.    Cardiovascular: Negative.   Gastrointestinal: Negative.   Genitourinary: Negative.   Musculoskeletal: Negative.   Skin: Negative.   Neurological: Negative.   Endo/Heme/Allergies: Negative.   Psychiatric/Behavioral: Negative.       Objective:   Vitals:   08/07/24 1507  BP: 118/70  Pulse: 65  Resp: 16  Temp: 98.3 F (36.8 C)  SpO2: 99%   Height: 5' 4 (162.6 cm)  Weight: 166 lb 3.2 oz (75.4 kg)   Physical Exam Constitutional:      Appearance: She is not diaphoretic.  HENT:     Head: Normocephalic.     Right Ear: Tympanic membrane, ear canal and external ear normal.     Left Ear: Tympanic membrane, ear canal and external ear normal.     Nose: Nose normal. No mucosal edema or rhinorrhea.     Mouth/Throat:     Pharynx: Uvula midline. No oropharyngeal exudate.  Eyes:     Conjunctiva/sclera: Conjunctivae normal.  Neck:     Thyroid: No thyromegaly.     Trachea: Trachea normal. No tracheal tenderness or tracheal deviation.  Cardiovascular:     Rate and Rhythm: Normal rate and regular rhythm.     Heart sounds: Normal heart sounds, S1 normal and S2 normal. No murmur heard. Pulmonary:     Effort: No respiratory distress.     Breath sounds: Normal breath sounds. No stridor. No wheezing or rales.  Lymphadenopathy:     Head:     Right side of head: No tonsillar adenopathy.     Left side of head: No tonsillar adenopathy.     Cervical: No cervical adenopathy.  Skin:    Findings: No erythema or rash.     Nails:  There is no clubbing.  Neurological:     Mental Status: She is alert.     Diagnostics: Spirometry was performed and demonstrated an FEV1 of 1.18 at 49 % of predicted.  Assessment and Plan:   1. Hypogammaglobulinemia (HCC)   2. Asthma, moderate persistent, well-controlled   3. Chronic pansinusitis   4. Anosmia   5. LPRD (laryngopharyngeal reflux disease)   6. Immunosuppression (HCC)    1.  Continue to Treat and prevent inflammation:   A.  Symbicort  160 - 2 inhalations twice a day with a spacer  B.  Fluticasone  + Azelastine  - 1 spray each nostril 2 times a day  2.  Continue to Treat and  prevent reflux:   A.  Consolidate caffeine and chocolate consumption  B.  Famotidine  40mg  - 1 tablet 1-2 times per day  3.  If needed:   A. Symbicort  160 - 2 inhalations every 6 hours (replaces albuterol )  B. Nasal saline  C. Antihistamine  4. Obtain sinus CT scan for chronic sinusitis and anosmia  5. Influenza = Tamiflu. Covid = Paxlovid   6. Return to clinic in 6 months or earlier if problem  Lynnae will have a CT scan of her sinuses performed in investigation of suspected chronic sinusitis giving rise to her anosmia occurring in the context of hypogammaglobulinemia secondary to her rituximab  use.  She has been on immunoglobulin infusions in the past but her neurologist is now directing this care and has removed her immunoglobulin infusions and overall she seems to have done okay in the past 6 months but I do not think she is able to clear her upper airway.  If she does have chronic sinusitis documented on her CT scan of her sinuses then we are going to give her prolonged antibiotic administration and reconsider giving her immunoglobulin infusions.  She will maintain anti-inflammatory therapy for both her upper and lower airways as noted above and continue to treat her reflux with famotidine .  I will contact her with the results of her CT scan once it is available for review.  Camellia Denis,  MD Allergy  / Immunology [facility]

## 2024-08-07 NOTE — Telephone Encounter (Signed)
 Called patient's insurance for CT Sinus w/o contrast PA -   [employer]/ [phone]) [phone] - spoke to [place] A. - DOB verified - No PA required.  Called [facility]/([phone]) [phone] - spoke to [place] - DOB verified - while scheduling CT Sinus, patient called in stating she wanted to speak to provider before scheduling.  CPT: 29513 Dx: J32.4, R43.0    Called patient - DOB verified - stated after speaking to her spouse - requesting to have MRI done instead of CT.  Patient advised message will be forwarded to provider.

## 2024-08-07 NOTE — Patient Instructions (Addendum)
  1.  Continue to Treat and prevent inflammation:   A.  Symbicort  160 - 2 inhalations twice a day with a spacer  B.  Fluticasone  + Azelastine  - 1 spray each nostril 2 times a day  2.  Continue to Treat and prevent reflux:   A.  Consolidate caffeine and chocolate consumption  B.  Famotidine  40mg  - 1 tablet 1-2 times per day  3.  If needed:   A. Symbicort  160 - 2 inhalations every 6 hours (replaces albuterol )  B. Nasal saline  C. Antihistamine  4. Obtain sinus CT scan for chronic sinusitis and anosmia  5. Influenza = Tamiflu. Covid = Paxlovid   6. Return to clinic in 6 months or earlier if problem

## 2024-08-08 ENCOUNTER — Ambulatory Visit
Admission: RE | Admit: 2024-08-08 | Discharge: 2024-08-08 | Disposition: A | Discharge status: Home / Self Care | Source: Ambulatory Visit | Attending: Allergy and Immunology | Admitting: Allergy and Immunology

## 2024-08-08 ENCOUNTER — Encounter: Payer: Self-pay | Admitting: Allergy and Immunology

## 2024-08-16 ENCOUNTER — Encounter: Payer: Self-pay | Admitting: Allergy and Immunology

## 2024-08-20 ENCOUNTER — Encounter (INDEPENDENT_AMBULATORY_CARE_PROVIDER_SITE_OTHER): Payer: Self-pay

## 2024-09-04 ENCOUNTER — Encounter: Payer: Self-pay | Admitting: Allergy and Immunology

## 2024-09-10 ENCOUNTER — Ambulatory Visit (INDEPENDENT_AMBULATORY_CARE_PROVIDER_SITE_OTHER)

## 2024-09-10 ENCOUNTER — Encounter (INDEPENDENT_AMBULATORY_CARE_PROVIDER_SITE_OTHER): Payer: Self-pay

## 2024-09-10 VITALS — BP 133/83 | HR 68 | Ht 64.0 in | Wt 160.0 lb

## 2024-09-10 DIAGNOSIS — J3489 Other specified disorders of nose and nasal sinuses: Secondary | ICD-10-CM

## 2024-09-10 DIAGNOSIS — R430 Anosmia: Secondary | ICD-10-CM

## 2024-09-10 DIAGNOSIS — J342 Deviated nasal septum: Secondary | ICD-10-CM

## 2024-09-10 DIAGNOSIS — J322 Chronic ethmoidal sinusitis: Secondary | ICD-10-CM

## 2024-09-10 DIAGNOSIS — J320 Chronic maxillary sinusitis: Secondary | ICD-10-CM

## 2024-09-10 DIAGNOSIS — J343 Hypertrophy of nasal turbinates: Secondary | ICD-10-CM

## 2024-09-10 DIAGNOSIS — J321 Chronic frontal sinusitis: Secondary | ICD-10-CM

## 2024-09-10 NOTE — Progress Notes (Addendum)
 Dear Dr. Maurilio, Here is my assessment for our mutual patient, Mary Odom. Thank you for allowing me the opportunity to care for your patient. Please do not hesitate to contact me should you have any other questions. Sincerely, Dr. Hadassah Parody  Otolaryngology Clinic Note Referring provider: Dr. Maurilio HPI:   Initial HPI (09/10/2024) Discussed the use of AI scribe software for clinical note transcription with the patient, who gave verbal consent to proceed. History of Present Illness Mary Odom is a 63 year old female with chronic sinusitis who presents with anosmia and nasal congestion  Anosmia - Sudden onset in January  - Intermittent loss of olfactory function with brief periods of partial return - Occasionally perceives scents, such as perfume, but olfactory ability fades within hours - symptoms improved with prednisone  course   Chronic nasal congestion and rhinorrhea - Persistent nasal congestion for approximately ten years - Runny nose with clear to occasionally green discharge - No associated facial pain - History of recurrent antibiotic treatment for sinus infections - always sounds like she has a cold   Current sinusitis and allergy  management - Uses azelastine  nasal spray and Flonase  twice daily - Performs regular sinus rinses with nasal saline  - Uncertain of the effectiveness of current therapies  H&N Surgery:no Personal or FHx of bleeding dz or anesthesia difficulty: no   GLP-1: no AP/AC: aspirin  Independent Review of Additional Tests or Records:  Referring note Camellia Maurilio, MD (08/07/24): hypogammaglobulinemia in seeing of rituximab  for MS, ashtm, allergic rhinitis. Prednisone  helped anosmia. If she does have chronic sinusitis documented on her CT scan of her sinuses then we are going to give her prolonged antibiotic administration and reconsider giving her immunoglobulin infusions    CT sinus 08/08/24: independently reviewed and shows b/l inferior  turbinate hypertrophy, frontals mostly clear, bilateral ethmoid opacification anteriorly and posteriorly, sphenoid clear, bilateral frontal sinus outflow tract obstruction, leftward nasal septal deviation, bilateral max sinus thickening and OMC obstruction bilaterally        PMH/Meds/All/SocHx/FamHx/ROS:   Past Medical History:  Diagnosis Date   Arthritis    Asthma, well controlled 08/29/2019   GERD (gastroesophageal reflux disease)    High cholesterol    Hypogammaglobulinemia    due to Rituxan    MS (multiple sclerosis)    Dx 8013     Past Surgical History:  Procedure Laterality Date   COLONOSCOPY  2012   2 benign polyps   GASTROCNEMIUS RECESSION Left 11/03/2023   Procedure: GASTROCNEMIUS SLIDE VERSUS ACHILLES TENDON LENGTHENING;  Surgeon: Barton Drape, MD;  Location: [facility];  Service: Orthopedics;  Laterality: Left;   MYRINGOTOMY WITH TUBE PLACEMENT Left 09/04/2019   TOTAL ANKLE ARTHROPLASTY Left 11/03/2023   Procedure: TOTAL ANKLE ARTHROPLASTY;  Surgeon: Barton Drape, MD;  Location: [facility];  Service: Orthopedics;  Laterality: Left;   WISDOM TOOTH EXTRACTION  1982    Family History  Problem Relation Age of Onset   Dementia Mother    Dementia Father    Asthma Daughter    Asthma Son    Urticaria Neg Hx    Eczema Neg Hx    Allergic rhinitis Neg Hx      Social Connections: Not on file     Current Outpatient Medications  Medication Instructions   aspirin 81 mg, Daily   azelastine  (ASTELIN ) 0.1 % nasal spray 1 spray, Each Nare, 2 times daily   azelastine  (OPTIVAR ) 0.05 % ophthalmic solution No dose, route, or frequency recorded.   cetirizine  (ZYRTEC )  10 mg, Oral, Daily PRN   famotidine  (PEPCID ) 40 mg, Oral, 2 times daily   fluticasone  (FLONASE ) 50 MCG/ACT nasal spray 1 spray, Each Nare, 2 times daily   gabapentin (NEURONTIN) 300 mg, Daily at bedtime   levocetirizine (XYZAL ) 5 mg, Oral, Daily PRN   Lutein-Zeaxanthin  25-5 MG CAPS lutein-zeaxanthin   meloxicam (MOBIC) 7.5 mg, Daily   modafinil (PROVIGIL) 200 mg, Daily   Multiple Vitamins-Minerals (PRESERVISION AREDS 2 PO) Take by mouth.   Omega-3 Fatty Acids (FISH OIL) 1000 MG CAPS 1 capsule, Daily   Psyllium 0.36 g CAPS Metamucil   pyridOXINE (VITAMIN B6) 50 mg, Daily   riTUXimab  (RITUXAN  IV) Inject into the vein.   simvastatin (ZOCOR) 20 mg, Daily at bedtime   Spacer/Aero-Holding Chambers DEVI 1 Device, Does not apply, As directed   SYMBICORT  160-4.5 MCG/ACT inhaler 2 puffs, Inhalation, 2 times daily   Vitamin D (Ergocalciferol) (DRISDOL) 2,000 Units, Daily     Physical Exam:   BP 133/83 (BP Location: Left Arm, Patient Position: Sitting)   Pulse 68   Ht 5' 4 (1.626 m)   Wt 160 lb (72.6 kg)   LMP 04/04/2011   SpO2 96%   BMI 27.46 kg/m   Salient findings:  CN II-XII intact with exception of olfactory nerve  Hyponasal speech   Bilateral EAC clear and TM intact with well pneumatized middle ear spaces Anterior rhinoscopy: Septum leftward deviation; bilateral inferior turbinates with hypertrophy b/l Nasal endoscopy was indicated to better evaluate the nose and paranasal sinuses, given the patient's history and exam findings, and is detailed below. No lesions of oral cavity/oropharynx No obviously palpable neck masses/lymphadenopathy/thyromegaly No respiratory distress or stridor  Seprately Identifiable Procedures:  Prior to initiating any procedures, risks/benefits/alternatives were explained to the patient and verbal consent obtained.  PROCEDURE (09/10/2024): Bilateral Diagnostic Rigid Nasal Endoscopy Pre-procedure diagnosis: Concern for anosmia, nasal obstruction, chronic sinusitis  Post-procedure diagnosis: same as above in addition to acute right maxillary sinusitis, nasal polyposis  Indication: See pre-procedure diagnosis and physical exam above Complications: None apparent EBL: 0 mL Anesthesia: Lidocaine  4% and topical decongestant  was topically sprayed in each nasal cavity  Description of Procedure:  Patient was identified. A rigid 30 degree endoscope was utilized to evaluate the sinonasal cavities, mucosa, sinus ostia and turbinates and septum.  Overall, signs of mucosal inflammation are noted.  Also noted are polypoid edema in ethmoids bilaterally, significant leftward nasal septal deviation, inferior turbinate hypertrophy.  Right Middle meatus: purulent drainage  Right SE Recess: clear Left MM: clear Left SE Recess: clear Photodocumentation was obtained.  CPT CODE -- 68768 - Mod 25   Impression & Plans:  Kashena Novitski is a 63 y.o. female with   1. Sinusitis chronic, ethmoidal   2. Chronic maxillary sinusitis   3. Nasal obstruction   4. Nasal septal deviation   5. Anosmia   6. Hypertrophy of both inferior nasal turbinates     Assessment and Plan Assessment & Plan Chronic rhinosinusitis with nasal polyps and acute right maxillary sinusitis Chronic rhinosinusitis with nasal polyps causing anosmia. Acute right maxillary sinusitis with minor infection. CT shows ethmoid sinus opacification and bilateral maxillary sinus thickening, frontal sinus outflow tract obstruction (OMC pattern sinusitis)  - Schedule endoscopic sinus surgery with septoplasty and turbinate reduction to remove polyps and improve sinus drainage - Discussed risks and benefits of surgery, including recurrence of symptoms, orbital or skull base injury which is rare, nasal septal perforation, expectations for post-op management, bleeding (5% risk). She understands  and is ready to proceed.  - Order preoperative CT scan if current scan is not adequate for surgical planning - will check if 2mm cuts ok - Prescribe one week of antibiotics and prednisone  to be taken one week prior to surgery. - Instruct to stop aspirin seven days before surgery. - Set up preoperative phone visit to review instructions and answer questions. - Continue nasal saline rinses  daily    Deviated nasal septum Deviated nasal septum contributing to nasal obstruction, more pronounced on the left side. Inferior turbinate hypertrophy - Perform septoplasty and inferior turbinate reduction during sinus surgery to correct deviation and improve nasal airflow, facilitating access during sinus surgery.   See below regarding exact medications prescribed this encounter including dosages and route: No orders of the defined types were placed in this encounter.   Thank you for allowing me the opportunity to care for your patient. Please do not hesitate to contact me should you have any other questions.  Sincerely, Hadassah Parody, MD Otolaryngologist (ENT), [facility] Phone: [phone] Fax: [phone]

## 2024-09-10 NOTE — Patient Instructions (Addendum)
 Mary Odom has sinus inflammation with polyp inflammation in the sinus cavities that are near the olfactory nerves that help with the sense of smell. They are also blocking some of the sinus outflow tracts specifically to the maxillary sinuses in the cheeks - this blockage can lead to infections. This is a common thing that we see with people with asthma and allergies. The polyps are inflammation only and not cancerous.   They are treated by removing them surgically. I was able to do a scope exam today and see the polyps and the outflow tracts of her sinuses. She has blocked tracts to the cheek sinuses as well as a deviated (curved) septum.   During surgery I will straighten her septum (this will help her breathe better and will help me to access all of the sinuses that need to be opened). I will also open up the sinuses that need to be opened based on where inflammation is located on her CT scan and on my exam.   Opening all of this will help with her sense of smell and feeling of constant congestion. It will also help prevent sinus infections. By opening things up, we will allow for her sinus rinses to more easily get to the places it needs to be and clean better.   We discussed that polyps can recur but in her case I think this would be a slow process as her symptoms came on slowly as well.   There is one medication to prevent polyps in the future but this is only covered by insurance after one sinus surgery has been performed and if the patient has significant return of polyps. This medication is a monoclonal anitbody (dupixent) that I dont think [place] would be candidate for given the rituximab  she is on. Regardless, would not consider until she has surgery and if polyps recur    We use CT scans to help us  with navigation during surgery so that we stay far away from critical structures (the brain, the eyes). I believe her CT scan should suffice but I will check with our image guidance colleagues to  confirm this. If not, she may need to get another scan. I will let you know once I know  The surgery is outpatient and 3 hours. Pain is well controlled with tylenol  and motrin and most patients take on average 3 prescription pain pills.   I am going to prescribe an antibiotic and steroid (7 days each) to be taken 7 days prior to surgery.   We will set up a phone visit 1 week prior to surgery to discuss these meds and ensure all questions answered   If questions feel free to call

## 2024-09-10 NOTE — Telephone Encounter (Signed)
 Mary Odom has been scheduled for ENT for 10/13 at 2:00 pm with Dr. Greggory.

## 2024-09-12 MED ORDER — PREDNISONE 10 MG PO TABS
ORAL_TABLET | ORAL | 0 refills | Status: AC
Start: 2024-09-12 — End: 2024-09-19

## 2024-09-12 MED ORDER — AMOXICILLIN-POT CLAVULANATE 875-125 MG PO TABS
1.0000 | ORAL_TABLET | Freq: Two times a day (BID) | ORAL | 0 refills | Status: AC
Start: 2024-09-12 — End: 2024-09-19

## 2024-09-24 ENCOUNTER — Ambulatory Visit: Admitting: Family Medicine

## 2024-10-01 ENCOUNTER — Other Ambulatory Visit (INDEPENDENT_AMBULATORY_CARE_PROVIDER_SITE_OTHER): Payer: Self-pay

## 2024-10-01 DIAGNOSIS — J3489 Other specified disorders of nose and nasal sinuses: Secondary | ICD-10-CM

## 2024-10-01 DIAGNOSIS — J343 Hypertrophy of nasal turbinates: Secondary | ICD-10-CM

## 2024-10-01 DIAGNOSIS — R430 Anosmia: Secondary | ICD-10-CM

## 2024-10-01 DIAGNOSIS — J322 Chronic ethmoidal sinusitis: Secondary | ICD-10-CM

## 2024-10-01 DIAGNOSIS — J342 Deviated nasal septum: Secondary | ICD-10-CM

## 2024-10-01 DIAGNOSIS — J320 Chronic maxillary sinusitis: Secondary | ICD-10-CM

## 2024-10-01 DIAGNOSIS — J321 Chronic frontal sinusitis: Secondary | ICD-10-CM

## 2024-10-05 ENCOUNTER — Encounter (INDEPENDENT_AMBULATORY_CARE_PROVIDER_SITE_OTHER): Payer: Self-pay

## 2024-10-08 ENCOUNTER — Ambulatory Visit (HOSPITAL_COMMUNITY)
Admission: RE | Admit: 2024-10-08 | Discharge: 2024-10-08 | Disposition: A | Discharge status: Home / Self Care | Source: Ambulatory Visit

## 2024-10-08 DIAGNOSIS — J321 Chronic frontal sinusitis: Secondary | ICD-10-CM | POA: Insufficient documentation

## 2024-10-08 DIAGNOSIS — J322 Chronic ethmoidal sinusitis: Secondary | ICD-10-CM | POA: Insufficient documentation

## 2024-10-08 DIAGNOSIS — J343 Hypertrophy of nasal turbinates: Secondary | ICD-10-CM | POA: Insufficient documentation

## 2024-10-08 DIAGNOSIS — J342 Deviated nasal septum: Secondary | ICD-10-CM | POA: Insufficient documentation

## 2024-10-08 DIAGNOSIS — J3489 Other specified disorders of nose and nasal sinuses: Secondary | ICD-10-CM | POA: Diagnosis present

## 2024-10-08 DIAGNOSIS — J320 Chronic maxillary sinusitis: Secondary | ICD-10-CM | POA: Insufficient documentation

## 2024-10-10 ENCOUNTER — Ambulatory Visit (INDEPENDENT_AMBULATORY_CARE_PROVIDER_SITE_OTHER)

## 2024-10-10 DIAGNOSIS — J320 Chronic maxillary sinusitis: Secondary | ICD-10-CM

## 2024-10-10 DIAGNOSIS — R430 Anosmia: Secondary | ICD-10-CM

## 2024-10-10 DIAGNOSIS — J3489 Other specified disorders of nose and nasal sinuses: Secondary | ICD-10-CM

## 2024-10-10 DIAGNOSIS — J321 Chronic frontal sinusitis: Secondary | ICD-10-CM

## 2024-10-10 DIAGNOSIS — J322 Chronic ethmoidal sinusitis: Secondary | ICD-10-CM

## 2024-10-10 DIAGNOSIS — J342 Deviated nasal septum: Secondary | ICD-10-CM

## 2024-10-10 DIAGNOSIS — J343 Hypertrophy of nasal turbinates: Secondary | ICD-10-CM

## 2024-10-11 ENCOUNTER — Encounter (INDEPENDENT_AMBULATORY_CARE_PROVIDER_SITE_OTHER): Payer: Self-pay

## 2024-10-13 ENCOUNTER — Encounter (INDEPENDENT_AMBULATORY_CARE_PROVIDER_SITE_OTHER): Payer: Self-pay

## 2024-10-13 NOTE — Progress Notes (Signed)
 Otolaryngology Clinic Phone Note Referring provider: Dr. Seabron HPI:   (10/10/2024) Pre-op phone note.  Patient location: [place] Provider location: clinic  Time spent on phone: 10 min  Pt reports no new issues. We discussed stopping her aspirin starting Friday and to start her abx and steroids as prescribed pre-op.   Independent Review of Additional Tests or Records:  none   PMH/Meds/All/SocHx/FamHx/ROS:   Past Medical History:  Diagnosis Date   Arthritis    Asthma, well controlled 08/29/2019   GERD (gastroesophageal reflux disease)    High cholesterol    Hypogammaglobulinemia    due to Rituxan    MS (multiple sclerosis)    Dx 1986     Past Surgical History:  Procedure Laterality Date   COLONOSCOPY  2012   2 benign polyps   GASTROCNEMIUS RECESSION Left 11/03/2023   Procedure: GASTROCNEMIUS SLIDE VERSUS ACHILLES TENDON LENGTHENING;  Surgeon: Barton Drape, MD;  Location: [facility];  Service: Orthopedics;  Laterality: Left;   MYRINGOTOMY WITH TUBE PLACEMENT Left 09/04/2019   TOTAL ANKLE ARTHROPLASTY Left 11/03/2023   Procedure: TOTAL ANKLE ARTHROPLASTY;  Surgeon: Barton Drape, MD;  Location: [facility];  Service: Orthopedics;  Laterality: Left;   WISDOM TOOTH EXTRACTION  1982    Family History  Problem Relation Age of Onset   Dementia Mother    Dementia Father    Asthma Daughter    Asthma Son    Urticaria Neg Hx    Eczema Neg Hx    Allergic rhinitis Neg Hx      Social Connections: Not on file     Current Outpatient Medications  Medication Instructions   aspirin 81 mg, Daily   azelastine  (ASTELIN ) 0.1 % nasal spray 1 spray, Each Nare, 2 times daily   azelastine  (OPTIVAR ) 0.05 % ophthalmic solution No dose, route, or frequency recorded.   cetirizine  (ZYRTEC ) 10 mg, Oral, Daily PRN   famotidine  (PEPCID ) 40 mg, Oral, 2 times daily   fluticasone  (FLONASE ) 50 MCG/ACT nasal spray 1 spray, Each Nare, 2 times daily    gabapentin (NEURONTIN) 300 mg, Daily at bedtime   levocetirizine (XYZAL ) 5 mg, Oral, Daily PRN   Lutein-Zeaxanthin 25-5 MG CAPS lutein-zeaxanthin   meloxicam (MOBIC) 7.5 mg, Daily   modafinil (PROVIGIL) 200 mg, Daily   Multiple Vitamins-Minerals (PRESERVISION AREDS 2 PO) Take by mouth.   Omega-3 Fatty Acids (FISH OIL) 1000 MG CAPS 1 capsule, Daily   Psyllium 0.36 g CAPS Metamucil   pyridOXINE (VITAMIN B6) 50 mg, Daily   riTUXimab  (RITUXAN  IV) Inject into the vein.   simvastatin (ZOCOR) 20 mg, Daily at bedtime   Spacer/Aero-Holding Chambers DEVI 1 Device, Does not apply, As directed   SYMBICORT  160-4.5 MCG/ACT inhaler 2 puffs, Inhalation, 2 times daily   Vitamin D (Ergocalciferol) (DRISDOL) 2,000 Units, Daily     Physical Exam:   None - phone note  Seprately Identifiable Procedures:  Prior to initiating any procedures, risks/benefits/alternatives were explained to the patient and verbal consent obtained. None  Impression & Plans:  Mary Odom is a 63 y.o. female with   1. Anosmia   2. Nasal septal deviation   3. Nasal obstruction   4. Chronic maxillary sinusitis   5. Sinusitis chronic, ethmoidal   6. Hypertrophy of both inferior nasal turbinates   7. Chronic frontal sinusitis    Plan for surgery as scheduled. All questions answered.   See below regarding exact medications prescribed this encounter including dosages and route: No orders of the defined types  were placed in this encounter.    Thank you for allowing me the opportunity to care for your patient. Please do not hesitate to contact me should you have any other questions.  Sincerely, Hadassah Parody, MD Otolaryngologist (ENT), [facility] Phone: [phone] Fax: [phone]

## 2024-10-19 ENCOUNTER — Other Ambulatory Visit (INDEPENDENT_AMBULATORY_CARE_PROVIDER_SITE_OTHER): Payer: Self-pay

## 2024-10-19 DIAGNOSIS — J3489 Other specified disorders of nose and nasal sinuses: Secondary | ICD-10-CM | POA: Diagnosis not present

## 2024-10-19 DIAGNOSIS — J343 Hypertrophy of nasal turbinates: Secondary | ICD-10-CM | POA: Diagnosis not present

## 2024-10-19 DIAGNOSIS — J342 Deviated nasal septum: Secondary | ICD-10-CM | POA: Diagnosis not present

## 2024-10-19 DIAGNOSIS — J328 Other chronic sinusitis: Secondary | ICD-10-CM | POA: Diagnosis not present

## 2024-10-19 MED ORDER — ONDANSETRON HCL 4 MG PO TABS: 4.0000 mg | ORAL_TABLET | Freq: Three times a day (TID) | ORAL | 0 refills | Status: AC | PRN

## 2024-10-19 MED ORDER — OXYCODONE HCL 5 MG PO TABS: 5.0000 mg | ORAL_TABLET | ORAL | 0 refills | Status: AC | PRN

## 2024-10-22 ENCOUNTER — Ambulatory Visit (INDEPENDENT_AMBULATORY_CARE_PROVIDER_SITE_OTHER)

## 2024-10-22 ENCOUNTER — Telehealth (INDEPENDENT_AMBULATORY_CARE_PROVIDER_SITE_OTHER): Payer: Self-pay

## 2024-10-22 ENCOUNTER — Encounter (INDEPENDENT_AMBULATORY_CARE_PROVIDER_SITE_OTHER): Payer: Self-pay

## 2024-10-22 NOTE — Telephone Encounter (Signed)
 Called Patient to let them that it was normal for the sneezing and it should improve after Dr. Greggory removes the packing on Wednesday patient understood.

## 2024-10-24 ENCOUNTER — Ambulatory Visit (INDEPENDENT_AMBULATORY_CARE_PROVIDER_SITE_OTHER)

## 2024-10-24 ENCOUNTER — Encounter (INDEPENDENT_AMBULATORY_CARE_PROVIDER_SITE_OTHER): Payer: Self-pay

## 2024-10-24 VITALS — BP 145/91 | HR 76

## 2024-10-24 DIAGNOSIS — J3489 Other specified disorders of nose and nasal sinuses: Secondary | ICD-10-CM

## 2024-10-24 DIAGNOSIS — J328 Other chronic sinusitis: Secondary | ICD-10-CM

## 2024-10-24 DIAGNOSIS — J343 Hypertrophy of nasal turbinates: Secondary | ICD-10-CM

## 2024-10-24 DIAGNOSIS — J342 Deviated nasal septum: Secondary | ICD-10-CM

## 2024-10-24 DIAGNOSIS — Z9889 Other specified postprocedural states: Secondary | ICD-10-CM

## 2024-10-24 MED ORDER — PREDNISOLONE ACETATE 1 % OP SUSP: OPHTHALMIC | 3 refills | Status: AC

## 2024-10-24 NOTE — Patient Instructions (Addendum)
-   Start doing saline rinses twice daily and mix in pred-forte drops (prescribed)    [facility] Nasal Saline Rinse   - start nasal saline rinses with [facility] Bottle available over the counter    Nasal Saline Irrigation instructions: If you choose to make your own salt water solution, You will need: Salt (kosher, canning, or pickling salt) Baking soda Nasal irrigation bottle (i.e. [facility] Sinus Rinse) Measuring spoon ( teaspoon) Distilled / boiled water   Mix solution Mix 1 teaspoon of salt, 1/2 teaspoon of baking soda and 1 cup of water into irrigation bottle ** May use saline packet instead of homemade recipe for this step if you prefer If medicine was prescribed to be mixed with solution, place this into bottle Examples 2 inches of 2% mupirocin ointment Budesonide  solution Pred forte  drops  Position your head: Lean over sink (about 45 degrees) Rotate head (about 45 degrees) so that one nostril is above the other Irrigate Insert tip of irrigation bottle into upper nostril so it forms a comfortable seal Irrigate while breathing through your mouth May remove the straw from the bottle in order to irrigate the entire solution (important if medicine was added) Exhale through nose when finished and blow nose as necessary  Repeat on opposite side with other 1/2 of solution (120 mL) or remake solution if all 240 mL was used on first side Wash irrigation bottle regularly, replace every 3 months

## 2024-10-29 ENCOUNTER — Encounter (INDEPENDENT_AMBULATORY_CARE_PROVIDER_SITE_OTHER): Payer: Self-pay

## 2024-10-29 NOTE — Progress Notes (Signed)
 S/p FESS and septo/turbs on 10/19/24 S: Patient reports no significant bleeding. Does report expected nasal pain. No fevers. Had some sneezing first few days. Otherwise no significant issues. O: Doyle splints in place, removed; after removal, no septal hematoma noted; Able to visualize axilla of MT bilaterally; tubinates reduced; no epistaxis. No evidence of infection. Nasal endoscopy was indicated to better evaluate the nose and paranasal sinuses, given the patient's history of recent sinus surgery and exam findings, and is detailed below. Prior to proceeding, verbal consent was obtained.   PROCEDURE: Bilateral Diagnostic Rigid Nasal Endoscopy with Debridement Pre-procedure diagnosis: Post-operative examination and care after Functional Endoscopic Sinus Surgery - of note: this procedure was NOT performed for management of the septoplasty or the turbinate reduction. Post-procedure diagnosis: same Indication: See pre-procedure diagnosis and physical exam above Complications: None apparent EBL: 0 mL Anesthesia: Lidocaine  4% and topical decongestant was topically sprayed in each nasal cavity   Description of Procedure:  Patient was identified. A rigid 30 degree endoscope was utilized to evaluate the sinonasal cavities, mucosa, sinus ostia and turbinates and septum.  Overall, signs of mucosal inflammation are noted. Also noted are post-surgical changes with some crusting and clot within bilateral middle meati with residual posi sep packing. These were debrided with a 8 Fr suction. After debridement, sinus cavity patency was improved. No adverse synechiae are noted today Right Middle meatus:  more patent Right SE Recess:  more patent Left MM:  more patent Left SE Recess:  more patent   CPT CODE -- 31237 - Mod 79, 50   A/P: 63 y.o. female with nasal obstruction, nasal septal deviation, inferior turbinate hypertrophy, chronic bilateral sinusitis (bilateral max, bilateral ethmoid, bilateral frontal) s/p   FESS and septoplasty.and ITR Doing well  Sinus cavities debrided. Resume nasal sprays as pre-op Start nasal saline rinses BID with pred-forte drops AP/AC: ok to resume aspirin F/u 1 weeks

## 2024-10-30 ENCOUNTER — Other Ambulatory Visit (INDEPENDENT_AMBULATORY_CARE_PROVIDER_SITE_OTHER): Payer: Self-pay

## 2024-10-30 MED ORDER — PREDNISONE 10 MG PO TABS: ORAL_TABLET | ORAL | 0 refills | Status: AC

## 2024-11-02 ENCOUNTER — Encounter (INDEPENDENT_AMBULATORY_CARE_PROVIDER_SITE_OTHER)

## 2024-11-08 ENCOUNTER — Ambulatory Visit (INDEPENDENT_AMBULATORY_CARE_PROVIDER_SITE_OTHER)

## 2024-11-08 VITALS — BP 128/78 | HR 68

## 2024-11-08 DIAGNOSIS — J3489 Other specified disorders of nose and nasal sinuses: Secondary | ICD-10-CM

## 2024-11-08 DIAGNOSIS — Z9889 Other specified postprocedural states: Secondary | ICD-10-CM

## 2024-11-08 DIAGNOSIS — J342 Deviated nasal septum: Secondary | ICD-10-CM

## 2024-11-08 DIAGNOSIS — J343 Hypertrophy of nasal turbinates: Secondary | ICD-10-CM

## 2024-11-08 DIAGNOSIS — J328 Other chronic sinusitis: Secondary | ICD-10-CM

## 2024-11-08 NOTE — Progress Notes (Signed)
 S/p FESS and septo/turbs on 10/19/24 S: Patient reports she is doing much better. She has been doing rinses twice daily, got a lot of crusting out and is now rinsing without residual crusting or clot coming from nose.   O: Septum healing well. No signs of infection. Nasal endoscopy was indicated to better evaluate the nose and paranasal sinuses, given the patient's history of recent sinus surgery and exam findings, and is detailed below. Prior to proceeding, verbal consent was obtained.   PROCEDURE: Bilateral Diagnostic Rigid Nasal Endoscopy with Bilateral Debridement Pre-procedure diagnosis: Post-operative examination and care after Functional Endoscopic Sinus Surgery - of note: this procedure was NOT performed for management of the septoplasty or the turbinate reduction. Post-procedure diagnosis: same Indication: See pre-procedure diagnosis and physical exam above Complications: None apparent EBL: 0 mL Anesthesia: Lidocaine  4% and topical decongestant was topically sprayed in each nasal cavity   Description of Procedure:  Patient was identified. A rigid 30 degree endoscope was utilized to evaluate the sinonasal cavities, mucosa, sinus ostia and turbinates and septum.  Overall, signs of mucosal inflammation are noted. Also noted are post-surgical changes with some crusting and clot within bilateral middle meati. Posi sep packing no longer present. Bilateral maxillary sinuses debrided. These were debrided with a 8 Fr suction. After debridement, sinus cavity patency was improved. No adverse synechiae are noted today Right Middle meatus:  patent Right SE Recess:  patent Left MM:  patent Left SE Recess:  patent   CPT CODE -- 31237 - Mod 79, 50   A/P: 63 y.o. female with chronic sinusitis s/p FESS and septoplasty.and ITR Doing well today  Sinus cavities debrided. Continue nasal saline rinses once daily  F/u 3 weeks

## 2024-12-06 ENCOUNTER — Ambulatory Visit (INDEPENDENT_AMBULATORY_CARE_PROVIDER_SITE_OTHER)

## 2024-12-06 ENCOUNTER — Encounter (INDEPENDENT_AMBULATORY_CARE_PROVIDER_SITE_OTHER): Payer: Self-pay

## 2024-12-06 VITALS — BP 153/90 | HR 65

## 2024-12-06 DIAGNOSIS — J328 Other chronic sinusitis: Secondary | ICD-10-CM

## 2024-12-06 DIAGNOSIS — J343 Hypertrophy of nasal turbinates: Secondary | ICD-10-CM

## 2024-12-06 DIAGNOSIS — Z9889 Other specified postprocedural states: Secondary | ICD-10-CM

## 2024-12-06 DIAGNOSIS — J3489 Other specified disorders of nose and nasal sinuses: Secondary | ICD-10-CM

## 2024-12-06 DIAGNOSIS — J342 Deviated nasal septum: Secondary | ICD-10-CM

## 2024-12-06 NOTE — Progress Notes (Signed)
 S/p FESS and septo/turbs on 10/19/24 S: Reports her sinuses feel great overall.  Still doing her rinses.  She intermittently gets her small back but this is seldom.  Wondering if her smell is going to return.  O: Septum healing well. No signs of infection. Nasal endoscopy was indicated to better evaluate the nose and paranasal sinuses, given the patient's history of recent sinus surgery and exam findings, and is detailed below. Prior to proceeding, verbal consent was obtained.   PROCEDURE: Bilateral Diagnostic Rigid Nasal Endoscopy with Bilateral Debridement Pre-procedure diagnosis: Post-operative examination and care after Functional Endoscopic Sinus Surgery - of note: this procedure was NOT performed for management of the septoplasty or the turbinate reduction. Post-procedure diagnosis: same Indication: See pre-procedure diagnosis and physical exam above Complications: None apparent EBL: 0 mL Anesthesia: Lidocaine  4% and topical decongestant was topically sprayed in each nasal cavity   Description of Procedure:  Patient was identified. A rigid 30 degree endoscope was utilized to evaluate the sinonasal cavities, mucosa, sinus ostia and turbinates and septum.  Overall, signs of mucosal inflammation are noted. Also noted are post-surgical changes with some crusting and clot within bilateral middle meati.  This was removed.  Posi sep packing no longer present. Bilateral maxillary sinuses debrided. These were debrided with a 8 Fr suction. After debridement, sinus cavity patency was improved. No adverse synechiae are noted today Right Middle meatus:  patent Right SE Recess:  patent Left MM:  patent Left SE Recess:  patent   CPT CODE -- 31237 - Mod 79, 50   A/P: 64 y.o. female with chronic sinusitis s/p FESS and septoplasty.and ITR Doing well today  Sinus cavities debrided. Continue nasal saline rinses once daily  Discussed that smell may or may not return fully.  Would still give her some more  time for swelling to abate but it is difficult to know if smell will fully return  F/u 2 months

## 2025-02-05 ENCOUNTER — Ambulatory Visit: Admitting: Allergy and Immunology

## 2025-02-11 ENCOUNTER — Ambulatory Visit (INDEPENDENT_AMBULATORY_CARE_PROVIDER_SITE_OTHER)
